# Patient Record
Sex: Male | Born: 1945 | Race: White | Hispanic: No | Marital: Married | Smoking: Former smoker
Health system: Southern US, Community
[De-identification: ages and names within clinical notes are randomized; demographics above are authoritative.]

## PROBLEM LIST (undated history)

## (undated) ENCOUNTER — Ambulatory Visit: Discharge status: Absent without leave | Payer: Medicare Other

## (undated) DIAGNOSIS — R002 Palpitations: Secondary | ICD-10-CM

## (undated) DIAGNOSIS — T7840XA Allergy, unspecified, initial encounter: Secondary | ICD-10-CM

## (undated) DIAGNOSIS — G8929 Other chronic pain: Secondary | ICD-10-CM

## (undated) DIAGNOSIS — M898X1 Other specified disorders of bone, shoulder: Secondary | ICD-10-CM

## (undated) DIAGNOSIS — F4002 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: Secondary | ICD-10-CM

## (undated) DIAGNOSIS — R351 Nocturia: Secondary | ICD-10-CM

## (undated) DIAGNOSIS — F32A Depression, unspecified: Secondary | ICD-10-CM

## (undated) DIAGNOSIS — F1011 Alcohol abuse, in remission: Secondary | ICD-10-CM

## (undated) DIAGNOSIS — F419 Anxiety disorder, unspecified: Secondary | ICD-10-CM

## (undated) DIAGNOSIS — J309 Allergic rhinitis, unspecified: Secondary | ICD-10-CM

## (undated) DIAGNOSIS — M19049 Primary osteoarthritis, unspecified hand: Secondary | ICD-10-CM

## (undated) DIAGNOSIS — Z8639 Personal history of other endocrine, nutritional and metabolic disease: Secondary | ICD-10-CM

## (undated) DIAGNOSIS — F1910 Other psychoactive substance abuse, uncomplicated: Secondary | ICD-10-CM

## (undated) DIAGNOSIS — E7800 Pure hypercholesterolemia, unspecified: Secondary | ICD-10-CM

## (undated) DIAGNOSIS — M1990 Unspecified osteoarthritis, unspecified site: Secondary | ICD-10-CM

## (undated) HISTORY — DX: Other psychoactive substance abuse, uncomplicated: F19.10

## (undated) HISTORY — DX: Anxiety disorder, unspecified: F41.9

## (undated) HISTORY — DX: Other specified disorders of bone, shoulder: M89.8X1

## (undated) HISTORY — DX: Palpitations: R00.2

## (undated) HISTORY — DX: Allergic rhinitis, unspecified: J30.9

## (undated) HISTORY — PX: EYE SURGERY: SHX253

## (undated) HISTORY — DX: Nocturia: R35.1

## (undated) HISTORY — DX: Unspecified osteoarthritis, unspecified site: M19.90

## (undated) HISTORY — PX: NO PAST SURGERIES: SHX2092

## (undated) HISTORY — DX: Primary osteoarthritis, unspecified hand: M19.049

## (undated) HISTORY — DX: 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: F40.02

## (undated) HISTORY — DX: Other chronic pain: G89.29

## (undated) HISTORY — DX: Pure hypercholesterolemia, unspecified: E78.00

## (undated) HISTORY — DX: Personal history of other endocrine, nutritional and metabolic disease: Z86.39

## (undated) HISTORY — DX: Alcohol abuse, in remission: F10.11

## (undated) HISTORY — DX: Depression, unspecified: F32.A

## (undated) HISTORY — DX: Allergy, unspecified, initial encounter: T78.40XA

---

## 2004-04-13 ENCOUNTER — Emergency Department: Payer: Self-pay | Admitting: Emergency Medicine

## 2005-08-04 ENCOUNTER — Ambulatory Visit: Payer: Self-pay | Admitting: General Surgery

## 2006-12-11 DIAGNOSIS — J3089 Other allergic rhinitis: Secondary | ICD-10-CM | POA: Insufficient documentation

## 2006-12-11 DIAGNOSIS — F4000 Agoraphobia, unspecified: Secondary | ICD-10-CM | POA: Insufficient documentation

## 2006-12-11 DIAGNOSIS — F411 Generalized anxiety disorder: Secondary | ICD-10-CM | POA: Insufficient documentation

## 2006-12-11 DIAGNOSIS — M19049 Primary osteoarthritis, unspecified hand: Secondary | ICD-10-CM | POA: Insufficient documentation

## 2009-03-12 DIAGNOSIS — E7800 Pure hypercholesterolemia, unspecified: Secondary | ICD-10-CM | POA: Insufficient documentation

## 2011-09-22 DIAGNOSIS — F419 Anxiety disorder, unspecified: Secondary | ICD-10-CM | POA: Insufficient documentation

## 2011-09-22 DIAGNOSIS — Z005 Encounter for examination of potential donor of organ and tissue: Secondary | ICD-10-CM | POA: Insufficient documentation

## 2011-09-22 DIAGNOSIS — E669 Obesity, unspecified: Secondary | ICD-10-CM | POA: Insufficient documentation

## 2014-03-08 ENCOUNTER — Emergency Department: Payer: Self-pay | Admitting: Emergency Medicine

## 2014-12-19 ENCOUNTER — Ambulatory Visit: Payer: Self-pay | Admitting: Physician Assistant

## 2015-01-20 ENCOUNTER — Telehealth: Payer: Self-pay | Admitting: Family Medicine

## 2015-01-20 NOTE — Telephone Encounter (Signed)
Pt sees Jeff Baker and states that he thinks he has ask him about hearing aids.  He thinks he has difficulty hearing.  He just wants to know some one to see for hearing loss testing.  His call back is [phone]  Thank Con Memos

## 2015-01-20 NOTE — Telephone Encounter (Signed)
[facility] would evaluate him for hearing loss and has audiologists there if further testing is required.

## 2015-01-20 NOTE — Telephone Encounter (Signed)
Would you prefer patient to schedule office visit with [place] to be evaluated first? Please advise

## 2015-01-20 NOTE — Telephone Encounter (Signed)
LMOVM for pt to return call. Need more information.

## 2015-01-20 NOTE — Telephone Encounter (Signed)
Pt called wanting to know an audiologist that you could refer him to f.  Call back  [phone]  Thanks, Con Memos

## 2015-03-06 DIAGNOSIS — R002 Palpitations: Secondary | ICD-10-CM | POA: Insufficient documentation

## 2015-03-06 DIAGNOSIS — M898X1 Other specified disorders of bone, shoulder: Secondary | ICD-10-CM | POA: Insufficient documentation

## 2015-03-06 DIAGNOSIS — M1990 Unspecified osteoarthritis, unspecified site: Secondary | ICD-10-CM | POA: Insufficient documentation

## 2015-03-06 DIAGNOSIS — Z8639 Personal history of other endocrine, nutritional and metabolic disease: Secondary | ICD-10-CM | POA: Insufficient documentation

## 2015-03-09 ENCOUNTER — Ambulatory Visit
Admission: RE | Admit: 2015-03-09 | Discharge: 2015-03-09 | Disposition: A | Discharge status: Home / Self Care | Payer: Medicare Other | Source: Ambulatory Visit | Attending: Family Medicine | Admitting: Family Medicine

## 2015-03-09 ENCOUNTER — Telehealth: Payer: Self-pay

## 2015-03-09 ENCOUNTER — Encounter: Payer: Self-pay | Admitting: Family Medicine

## 2015-03-09 ENCOUNTER — Ambulatory Visit (INDEPENDENT_AMBULATORY_CARE_PROVIDER_SITE_OTHER): Payer: Medicare Other | Admitting: Family Medicine

## 2015-03-09 VITALS — BP 116/66 | HR 64 | Temp 98.0°F | Resp 16 | Ht 73.0 in | Wt 265.4 lb

## 2015-03-09 DIAGNOSIS — Z87442 Personal history of urinary calculi: Secondary | ICD-10-CM | POA: Diagnosis present

## 2015-03-09 DIAGNOSIS — F411 Generalized anxiety disorder: Secondary | ICD-10-CM

## 2015-03-09 DIAGNOSIS — R351 Nocturia: Secondary | ICD-10-CM | POA: Diagnosis not present

## 2015-03-09 DIAGNOSIS — Z1211 Encounter for screening for malignant neoplasm of colon: Secondary | ICD-10-CM

## 2015-03-09 DIAGNOSIS — Z23 Encounter for immunization: Secondary | ICD-10-CM | POA: Diagnosis not present

## 2015-03-09 DIAGNOSIS — Z0000 Encounter for general adult medical examination without abnormal findings: Secondary | ICD-10-CM

## 2015-03-09 DIAGNOSIS — M546 Pain in thoracic spine: Secondary | ICD-10-CM | POA: Diagnosis not present

## 2015-03-09 DIAGNOSIS — N200 Calculus of kidney: Secondary | ICD-10-CM | POA: Diagnosis not present

## 2015-03-09 DIAGNOSIS — M7662 Achilles tendinitis, left leg: Secondary | ICD-10-CM | POA: Diagnosis not present

## 2015-03-09 DIAGNOSIS — Z131 Encounter for screening for diabetes mellitus: Secondary | ICD-10-CM | POA: Diagnosis not present

## 2015-03-09 MED ORDER — SERTRALINE HCL 100 MG PO TABS: 100.0000 mg | ORAL_TABLET | Freq: Every day | ORAL | Status: DC

## 2015-03-09 NOTE — Patient Instructions (Signed)
We will call you with referrals, lab and x-ray report.

## 2015-03-09 NOTE — Telephone Encounter (Signed)
-----   Message from Carmon Ginsberg, [place] sent at 03/09/2015  1:02 PM EST ----- There is a left kidney stone described as small. Do you wish to see a urologist at this time? May consider drinking more fluids esp.water and see if it will flush out.

## 2015-03-09 NOTE — Telephone Encounter (Signed)
Patient has been advised he declined seeing urologist at this time, advised patient to drink adequate amounts of fluid especially water to help flush it out. KW

## 2015-03-09 NOTE — Progress Notes (Signed)
Subjective:     Patient ID: Jeff Baker, male   DOB: 01-Jun-1945, 70 y.o.   MRN: [id number]  HPI  Chief Complaint  Patient presents with  . Annual Exam    Patient comes in office today for his annual physical. Patient would like to address at todays visit; pain in his kidneys for the past 3-4 weeks, pain in his left achilles tendon and discuss tendinitis getting worse. Patient is due today for Flu vaccine he states his last Tdap was within the past 10 years, it is reccomended that patient get shingles vaccine today he states that he will address with physcian  States he has been having persistent left sided back pain for 3-4 weeks. Mild discomfort when he changes positions. Reports that he has been told he had kidney stones in the past but does not remember passing any.    Review of Systems General: Feeling well. Reports tetanus within 10 years. Agrees to Zoster but not in stock today. HEENT: does not see a dentist but is pending an eye exam: "early cataracts". States he had an audiology evaluation last year due to chronic tinnitus and was told he needed hearing aids. Wishes copy of report to take to different hearing aid vendors. Cardiovascular: no chest pain, shortness of breath, or palpitations GI: no heartburn, no change in bowel habits. He is due this year for repeat colonoscopy GU: nocturia x 2,no other change in bladder habits  Neuro: no change in memory; defers cognitive screen. Psychiatric: not depressed Musculoskeletal: left Achilles pain since September. Takes ibuprofen 600 mg.twice daily as needed. Currently notices it primarily when he goes up hills. Fall has occurred when he tripped in stream beds while fly fishing.    Objective:   Physical Exam  Constitutional: He appears well-developed and well-nourished. No distress.  Eyes: PERRLA, EOMI Neck: no thyromegaly, tenderness or nodules, no carotid bruits. ENT: TM's intact without inflammation; No tonsillar enlargement or  exudate, Lungs: Clear Heart : RRR without murmur or gallop Abd: bowel sounds present, soft, non-tender, no organomegaly Rectal: Prostate check deferred due to patient's body habitus Extremities: no edema, left achilles tender at insertion. DF/PF 5/5 Back: localizes pain to left lower thoracic area. No rash or significant tenderness to palpation. SLR's to 80 degrees without radiation of pain.     Assessment:    1. Medicare annual wellness visit, subsequent  2. Anxiety, generalized - sertraline (ZOLOFT) 100 MG tablet; Take 1 tablet (100 mg total) by mouth daily.  Dispense: 90 tablet; Refill: 3 - Ambulatory referral to Orthopedic Surgery  3. Tendonitis, Achilles, left  4. Left-sided thoracic back pain  5. History of kidney stones - DG Abd 2 Views; Future  6. Need for influenza vaccination - Flu Vaccine QUAD 36+ mos IM  7. Nocturia - PSA  8. Screen for colon cancer - Ambulatory referral to Gastroenterology  9. Screening for diabetes mellitus - Comprehensive metabolic panel     Plan:    Further f/u pending labs and receipt of audiology consult note.

## 2015-03-10 LAB — COMPREHENSIVE METABOLIC PANEL
A/G RATIO: 1.7 (ref 1.1–2.5)
ALT: 23 IU/L (ref 0–44)
AST: 28 IU/L (ref 0–40)
Albumin: 4.3 g/dL (ref 3.6–4.8)
Alkaline Phosphatase: 68 IU/L (ref 39–117)
BUN/Creatinine Ratio: 20 (ref 10–22)
BUN: 20 mg/dL (ref 8–27)
Bilirubin Total: 0.8 mg/dL (ref 0.0–1.2)
CALCIUM: 9.5 mg/dL (ref 8.6–10.2)
CO2: 25 mmol/L (ref 18–29)
CREATININE: 0.99 mg/dL (ref 0.76–1.27)
Chloride: 103 mmol/L (ref 96–106)
GFR, EST AFRICAN AMERICAN: 89 mL/min/{1.73_m2} (ref >59)
GFR, EST NON AFRICAN AMERICAN: 77 mL/min/{1.73_m2} (ref >59)
GLUCOSE: 103 mg/dL — AB (ref 65–99)
Globulin, Total: 2.6 g/dL (ref 1.5–4.5)
Potassium: 4.8 mmol/L (ref 3.5–5.2)
Sodium: 143 mmol/L (ref 134–144)
TOTAL PROTEIN: 6.9 g/dL (ref 6.0–8.5)

## 2015-03-10 LAB — PSA: Prostate Specific Ag, Serum: 0.8 ng/mL (ref 0.0–4.0)

## 2015-03-13 ENCOUNTER — Other Ambulatory Visit: Payer: Self-pay

## 2015-03-13 ENCOUNTER — Telehealth: Payer: Self-pay

## 2015-03-13 DIAGNOSIS — M7660 Achilles tendinitis, unspecified leg: Secondary | ICD-10-CM | POA: Insufficient documentation

## 2015-03-13 NOTE — Telephone Encounter (Signed)
Gastroenterology Pre-Procedure Review  Request Date: 04/28/15 Requesting Physician:  Carmon Ginsberg, PA  PATIENT REVIEW QUESTIONS: The patient responded to the following health history questions as indicated:    1. Are you having any GI issues? no 2. Do you have a personal history of Polyps? no 3. Do you have a family history of Colon Cancer or Polyps? no 4. Diabetes Mellitus? no 5. Joint replacements in the past 12 months?no 6. Major health problems in the past 3 months?no 7. Any artificial heart valves, MVP, or defibrillator?no    MEDICATIONS & ALLERGIES:    Patient reports the following regarding taking any anticoagulation/antiplatelet therapy:   Plavix, Coumadin, Eliquis, Xarelto, Lovenox, Pradaxa, Brilinta, or Effient? no Aspirin? yes (ASA 81mg )  Patient confirms/reports the following medications:  Current Outpatient Prescriptions  Medication Sig Dispense Refill  . aspirin EC 81 MG tablet Take by mouth.    . Multiple Vitamin (MULTIVITAMIN) capsule Take by mouth.    . sertraline (ZOLOFT) 100 MG tablet Take 1 tablet (100 mg total) by mouth daily. 90 tablet 3   No current facility-administered medications for this visit.    Patient confirms/reports the following allergies:  No Known Allergies  No orders of the defined types were placed in this encounter.    AUTHORIZATION INFORMATION Primary Insurance: 1D#: Group #:  Secondary Insurance: 1D#: Group #:  SCHEDULE INFORMATION: Date: 04/28/15 Time: Location: [facility]

## 2015-04-05 ENCOUNTER — Other Ambulatory Visit: Payer: Self-pay | Admitting: Family Medicine

## 2015-04-06 ENCOUNTER — Other Ambulatory Visit: Payer: Self-pay | Admitting: Family Medicine

## 2015-04-28 ENCOUNTER — Encounter: Payer: Self-pay | Admitting: Anesthesiology

## 2015-04-28 ENCOUNTER — Encounter
Admission: RE | Disposition: A | Discharge status: Home / Self Care | Payer: Self-pay | Source: Ambulatory Visit | Attending: Gastroenterology

## 2015-04-28 ENCOUNTER — Ambulatory Visit
Admission: RE | Admit: 2015-04-28 | Discharge: 2015-04-28 | Disposition: A | Discharge status: Home / Self Care | Payer: Medicare Other | Source: Ambulatory Visit | Attending: Gastroenterology | Admitting: Gastroenterology

## 2015-04-28 ENCOUNTER — Ambulatory Visit: Payer: Medicare Other | Admitting: Anesthesiology

## 2015-04-28 DIAGNOSIS — M19049 Primary osteoarthritis, unspecified hand: Secondary | ICD-10-CM | POA: Insufficient documentation

## 2015-04-28 DIAGNOSIS — D122 Benign neoplasm of ascending colon: Secondary | ICD-10-CM | POA: Diagnosis not present

## 2015-04-28 DIAGNOSIS — Z811 Family history of alcohol abuse and dependence: Secondary | ICD-10-CM | POA: Diagnosis not present

## 2015-04-28 DIAGNOSIS — D123 Benign neoplasm of transverse colon: Secondary | ICD-10-CM | POA: Insufficient documentation

## 2015-04-28 DIAGNOSIS — G8929 Other chronic pain: Secondary | ICD-10-CM | POA: Diagnosis not present

## 2015-04-28 DIAGNOSIS — Z1211 Encounter for screening for malignant neoplasm of colon: Secondary | ICD-10-CM | POA: Insufficient documentation

## 2015-04-28 DIAGNOSIS — F419 Anxiety disorder, unspecified: Secondary | ICD-10-CM | POA: Diagnosis not present

## 2015-04-28 DIAGNOSIS — F17210 Nicotine dependence, cigarettes, uncomplicated: Secondary | ICD-10-CM | POA: Diagnosis not present

## 2015-04-28 DIAGNOSIS — Z820 Family history of epilepsy and other diseases of the nervous system: Secondary | ICD-10-CM | POA: Insufficient documentation

## 2015-04-28 DIAGNOSIS — J309 Allergic rhinitis, unspecified: Secondary | ICD-10-CM | POA: Insufficient documentation

## 2015-04-28 DIAGNOSIS — Z79899 Other long term (current) drug therapy: Secondary | ICD-10-CM | POA: Insufficient documentation

## 2015-04-28 DIAGNOSIS — R351 Nocturia: Secondary | ICD-10-CM | POA: Diagnosis not present

## 2015-04-28 DIAGNOSIS — M1990 Unspecified osteoarthritis, unspecified site: Secondary | ICD-10-CM | POA: Insufficient documentation

## 2015-04-28 DIAGNOSIS — R002 Palpitations: Secondary | ICD-10-CM | POA: Diagnosis not present

## 2015-04-28 DIAGNOSIS — Z8489 Family history of other specified conditions: Secondary | ICD-10-CM | POA: Diagnosis not present

## 2015-04-28 DIAGNOSIS — F17290 Nicotine dependence, other tobacco product, uncomplicated: Secondary | ICD-10-CM | POA: Diagnosis not present

## 2015-04-28 DIAGNOSIS — M25519 Pain in unspecified shoulder: Secondary | ICD-10-CM | POA: Insufficient documentation

## 2015-04-28 DIAGNOSIS — Z7982 Long term (current) use of aspirin: Secondary | ICD-10-CM | POA: Diagnosis not present

## 2015-04-28 DIAGNOSIS — E7800 Pure hypercholesterolemia, unspecified: Secondary | ICD-10-CM | POA: Insufficient documentation

## 2015-04-28 HISTORY — PX: COLONOSCOPY WITH PROPOFOL: SHX5780

## 2015-04-28 SURGERY — COLONOSCOPY WITH PROPOFOL
Anesthesia: General

## 2015-04-28 MED ORDER — PROPOFOL 10 MG/ML IV BOLUS
INTRAVENOUS | Status: DC | PRN
  Administered 2015-04-28: 50 mg via INTRAVENOUS

## 2015-04-28 MED ORDER — SODIUM CHLORIDE 0.9 % IV SOLN
INTRAVENOUS | Status: DC
  Administered 2015-04-28: 1000 mL via INTRAVENOUS

## 2015-04-28 MED ORDER — LIDOCAINE HCL (CARDIAC) 20 MG/ML IV SOLN
INTRAVENOUS | Status: DC | PRN
  Administered 2015-04-28: 60 mg via INTRAVENOUS

## 2015-04-28 MED ORDER — PROPOFOL 500 MG/50ML IV EMUL
INTRAVENOUS | Status: DC | PRN
  Administered 2015-04-28: 100 ug/kg/min via INTRAVENOUS

## 2015-04-28 NOTE — H&P (Signed)
  [facility]  [address] [place], [place] [address] Phone: [phone] Fax : [phone]  Primary Care Physician:  Carmon Ginsberg, [place] Primary Gastroenterologist:  Dr. Allen Norris  Pre-Procedure History & Physical: HPI:  Jeff Baker is a 70 y.o. male is here for a screening colonoscopy.   Past Medical History  Diagnosis Date  . Arthritis   . Anxiety   . 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 History of alcohol abuse   . Allergic rhinitis   . Pure hypercholesterolemia   . Osteoarthrosis, hand   . Chronic scapular pain   . History of hyperkalemia   . Nocturia   . Palpitations     Past Surgical History  Procedure Laterality Date  . No past surgeries      Prior to Admission medications   Medication Sig Start Date End Date Taking? Authorizing Provider  aspirin EC 81 MG tablet Take by mouth.    Historical Provider, MD  Multiple Vitamin (MULTIVITAMIN) capsule Take by mouth.    Historical Provider, MD  sertraline (ZOLOFT) 100 MG tablet Take 1 tablet (100 mg total) by mouth daily. 03/09/15   Carmon Ginsberg, PA    Allergies as of 03/13/2015  . (No Known Allergies)    Family History  Problem Relation Age of Onset  . Dementia Mother   . Alcohol abuse Father   . Drug abuse Father     Social History   Social History  . Marital Status: Married    Spouse Name: N/A  . Number of Children: N/A  . Years of Education: N/A   Occupational History  . Not on file.   Social History Main Topics  . Smoking status: Current Every Day Smoker    Types: Cigarettes, Cigars  . Smokeless tobacco: Never Used     Comment: Former tobacco smoker quit in 1977  . Alcohol Use: No     Comment: had a history of alcohol abuse been sober >31 years  . Drug Use: No  . Sexual Activity: Not on file   Other Topics Concern  . Not on file   Social History Narrative    Review of Systems: See HPI, otherwise negative ROS  Physical Exam: BP 148/68 mmHg  Pulse 59   Temp(Src) 97.5 F (36.4 C) (Oral)  Resp 20  Ht 6\' 1"  (1.854 m)  Wt 258 lb (117.028 kg)  BMI 34.05 kg/m2  SpO2 98% General:   Alert,  pleasant and cooperative in NAD Head:  Normocephalic and atraumatic. Neck:  Supple; no masses or thyromegaly. Lungs:  Clear throughout to auscultation.    Heart:  Regular rate and rhythm. Abdomen:  Soft, nontender and nondistended. Normal bowel sounds, without guarding, and without rebound.   Neurologic:  Alert and  oriented x4;  grossly normal neurologically.  Impression/Plan: Jeff Baker is now here to undergo a screening colonoscopy.  Risks, benefits, and alternatives regarding colonoscopy have been reviewed with the patient.  Questions have been answered.  All parties agreeable.

## 2015-04-28 NOTE — Transfer of Care (Signed)
Immediate Anesthesia Transfer of Care Note  Patient: Jeff Baker  Procedure(s) Performed: Procedure(s): COLONOSCOPY WITH PROPOFOL (N/A)  Patient Location: Endoscopy Unit  Anesthesia Type:General  Level of Consciousness: awake, alert , oriented and patient cooperative  Airway & Oxygen Therapy: Patient Spontanous Breathing and Patient connected to nasal cannula oxygen  Post-op Assessment: Report given to RN, Post -op Vital signs reviewed and stable and Patient moving all extremities X 4  Post vital signs: Reviewed and stable  Last Vitals:  Filed Vitals:   04/28/15 0911  BP: 148/68  Pulse: 59  Temp: 36.4 C  Resp: 20    Complications: No apparent anesthesia complications

## 2015-04-28 NOTE — Op Note (Signed)
[facility] Gastroenterology Patient Name: Jeff Baker Procedure Date: 04/28/2015 10:18 AM MRN: [id number] Account #: [id number] Date of Birth: 11-16-1945 Admit Type: Outpatient Age: 70 Room: [facility] ENDO ROOM 4 Gender: Male Note Status: Finalized Procedure:            Colonoscopy Indications:          Screening for colorectal malignant neoplasm Providers:            Lucilla Lame, MD Referring MD:         Rose Fillers. Jaynie Crumble, MD (Referring MD) Medicines:            Propofol per Anesthesia Complications:        No immediate complications. Procedure:            Pre-Anesthesia Assessment:                       - Prior to the procedure, a History and Physical was                        performed, and patient medications and allergies were                        reviewed. The patient's tolerance of previous                        anesthesia was also reviewed. The risks and benefits of                        the procedure and the sedation options and risks were                        discussed with the patient. All questions were                        answered, and informed consent was obtained. Prior                        Anticoagulants: The patient has taken no previous                        anticoagulant or antiplatelet agents. ASA Grade                        Assessment: II - A patient with mild systemic disease.                        After reviewing the risks and benefits, the patient was                        deemed in satisfactory condition to undergo the                        procedure.                       After obtaining informed consent, the colonoscope was                        passed under direct vision. Throughout the procedure,  the patient's blood pressure, pulse, and oxygen                        saturations were monitored continuously. The                        Colonoscope was introduced through the anus and         advanced to the the cecum, identified by appendiceal                        orifice and ileocecal valve. The colonoscopy was                        performed without difficulty. The patient tolerated the                        procedure well. The quality of the bowel preparation                        was excellent. Findings:      The perianal and digital rectal examinations were normal.      A 4 mm polyp was found in the ascending colon. The polyp was sessile.       The polyp was removed with a cold biopsy forceps. Resection and       retrieval were complete.      A 3 mm polyp was found in the transverse colon. The polyp was sessile.       The polyp was removed with a cold biopsy forceps. Resection and       retrieval were complete. Impression:           - One 4 mm polyp in the ascending colon, removed with a                        cold biopsy forceps. Resected and retrieved.                       - One 3 mm polyp in the transverse colon, removed with                        a cold biopsy forceps. Resected and retrieved. Recommendation:       - Await pathology results.                       - Repeat colonoscopy in 5 years if polyp adenoma and 10                        years if hyperplastic Procedure Code(s):    --- Professional ---                       [phone], Colonoscopy, flexible; with biopsy, single or                        multiple Diagnosis Code(s):    --- Professional ---                       Z12.11, Encounter for screening for malignant neoplasm  of colon                       D12.2, Benign neoplasm of ascending colon                       D12.3, Benign neoplasm of transverse colon (hepatic                        flexure or splenic flexure) CPT copyright 2016 American Medical Association. All rights reserved. The codes documented in this report are preliminary and upon coder review may  be revised to meet current compliance requirements. Lucilla Lame, MD 04/28/2015 10:37:49 AM This report has been signed electronically. Number of Addenda: 0 Note Initiated On: 04/28/2015 10:18 AM Scope Withdrawal Time: 0 hours 11 minutes 17 seconds  Total Procedure Duration: 0 hours 13 minutes 44 seconds       [facility]

## 2015-04-28 NOTE — Anesthesia Preprocedure Evaluation (Signed)
Anesthesia Evaluation  Patient identified by MRN, date of birth, ID band Patient awake    Reviewed: Allergy & Precautions, H&P , NPO status , Patient's Chart, lab work & pertinent test results, reviewed documented beta blocker date and time   History of Anesthesia Complications Negative for: history of anesthetic complications  Airway Mallampati: II  TM Distance: >3 FB Neck ROM: full    Dental no notable dental hx. (+) Partial Upper, Missing, Chipped, Poor Dentition   Pulmonary neg shortness of breath, neg sleep apnea, neg COPD, Recent URI , Residual Cough, Current Smoker (Cigars),    Pulmonary exam normal breath sounds clear to auscultation       Cardiovascular Exercise Tolerance: Good negative cardio ROS Normal cardiovascular exam Rhythm:regular Rate:Normal     Neuro/Psych PSYCHIATRIC DISORDERS (Anxiety) negative neurological ROS     GI/Hepatic negative GI ROS, Neg liver ROS,   Endo/Other  negative endocrine ROS  Renal/GU negative Renal ROS  negative genitourinary   Musculoskeletal   Abdominal   Peds  Hematology negative hematology ROS (+)   Anesthesia Other Findings Past Medical History:   Arthritis                                                    Anxiety                                                      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                           History of alcohol abuse                                     Allergic rhinitis                                            Pure hypercholesterolemia                                    Osteoarthrosis, hand                                         Chronic scapular pain                                        History of hyperkalemia                                      Nocturia  Palpitations                                                 Reproductive/Obstetrics negative OB ROS                              Anesthesia Physical Anesthesia Plan  ASA: II  Anesthesia Plan: General   Post-op Pain Management:    Induction:   Airway Management Planned:   Additional Equipment:   Intra-op Plan:   Post-operative Plan:   Informed Consent: I have reviewed the patients History and Physical, chart, labs and discussed the procedure including the risks, benefits and alternatives for the proposed anesthesia with the patient or authorized representative who has indicated his/her understanding and acceptance.   Dental Advisory Given  Plan Discussed with: Anesthesiologist, CRNA and Surgeon  Anesthesia Plan Comments:         Anesthesia Quick Evaluation

## 2015-04-29 ENCOUNTER — Encounter: Payer: Self-pay | Admitting: Gastroenterology

## 2015-04-29 LAB — SURGICAL PATHOLOGY

## 2015-04-30 ENCOUNTER — Encounter: Payer: Self-pay | Admitting: Gastroenterology

## 2015-04-30 NOTE — Anesthesia Postprocedure Evaluation (Signed)
Anesthesia Post Note  Patient: Jeff Baker  Procedure(s) Performed: Procedure(s) (LRB): COLONOSCOPY WITH PROPOFOL (N/A)  Patient location during evaluation: Endoscopy Anesthesia Type: General Level of consciousness: awake and alert Pain management: pain level controlled Vital Signs Assessment: post-procedure vital signs reviewed and stable Respiratory status: spontaneous breathing, nonlabored ventilation, respiratory function stable and patient connected to nasal cannula oxygen Cardiovascular status: blood pressure returned to baseline and stable Postop Assessment: no signs of nausea or vomiting Anesthetic complications: no    Last Vitals:  Filed Vitals:   04/28/15 1100 04/28/15 1110  BP: 137/76 139/73  Pulse:    Temp:    Resp:      Last Pain:  Filed Vitals:   04/29/15 0747  PainSc: 0-No pain                 Martha Clan

## 2015-07-31 ENCOUNTER — Ambulatory Visit (INDEPENDENT_AMBULATORY_CARE_PROVIDER_SITE_OTHER): Payer: Medicare Other | Admitting: Family Medicine

## 2015-07-31 ENCOUNTER — Encounter: Payer: Self-pay | Admitting: Family Medicine

## 2015-07-31 VITALS — BP 138/70 | HR 72 | Temp 97.9°F | Resp 16 | Wt 268.6 lb

## 2015-07-31 DIAGNOSIS — T148 Other injury of unspecified body region: Secondary | ICD-10-CM

## 2015-07-31 DIAGNOSIS — T148XXA Other injury of unspecified body region, initial encounter: Secondary | ICD-10-CM

## 2015-07-31 DIAGNOSIS — W57XXXA Bitten or stung by nonvenomous insect and other nonvenomous arthropods, initial encounter: Secondary | ICD-10-CM | POA: Diagnosis not present

## 2015-07-31 MED ORDER — DOXYCYCLINE HYCLATE 100 MG PO TABS: 100.0000 mg | ORAL_TABLET | Freq: Two times a day (BID) | ORAL | Status: DC

## 2015-07-31 NOTE — Progress Notes (Signed)
Subjective:     Patient ID: Jeff Baker, male   DOB: [birth year], 70 y.o.   MRN: [id number]  HPI  Chief Complaint  Patient presents with  . Skin Problem    Patient comes in office today with concerns of changes to his skin. Patient believes that he may have been bitten by an insect wihile walking [place] the wilderness. Patient states that he has two red circle like rings on his left forearm that has been present for a week, patient denies skin is either itchy or burning.   States he has removed ticks from other areas of his body and also wishes me to check one on his left mid-back. States he noticed the rash after being in the woods in [place]. No flu like sx reported.   Review of Systems     Objective:   Physical Exam  Constitutional: He appears well-developed and well-nourished. No distress.  Skin:  Two small concentric bruised like areas with central clearing noted on his anterior forearm. Non-tender to the touch with no underlying induration. Tick bite on left mid-back cleaned with alcohol with no f.b. Visualized.       Assessment:    1. Tick bite - doxycycline (VIBRA-TABS) 100 MG tablet; Take 1 tablet (100 mg total) by mouth 2 (two) times daily.  Dispense: 14 tablet; Refill: 1  2. Bruising     Plan:    To start antibiotic if rash does not improve with warm compresses or he develops flu-like sx.

## 2015-07-31 NOTE — Patient Instructions (Signed)
Apply arm compresses to arm rash/bruising. If expanding area of redness or not improving add antibiotic.

## 2015-11-05 ENCOUNTER — Ambulatory Visit: Payer: Self-pay | Admitting: Family Medicine

## 2015-12-01 ENCOUNTER — Encounter: Payer: Self-pay | Admitting: Family Medicine

## 2015-12-01 ENCOUNTER — Ambulatory Visit (INDEPENDENT_AMBULATORY_CARE_PROVIDER_SITE_OTHER): Payer: Medicare Other | Admitting: Family Medicine

## 2015-12-01 VITALS — BP 128/64 | HR 71 | Temp 98.3°F | Resp 14 | Wt 250.6 lb

## 2015-12-01 DIAGNOSIS — S51012A Laceration without foreign body of left elbow, initial encounter: Secondary | ICD-10-CM

## 2015-12-01 DIAGNOSIS — Z23 Encounter for immunization: Secondary | ICD-10-CM

## 2015-12-01 NOTE — Patient Instructions (Signed)
Skin Tear Care  A skin tear is a wound in which the top layer of skin has peeled off. This is a common problem with aging because the skin becomes thinner and more fragile as a person gets older. In addition, some medicines, such as oral corticosteroids, can lead to skin thinning if taken for long periods of time.   A skin tear is often repaired with tape or skin adhesive strips. This keeps the skin that has been peeled off in contact with the healthier skin beneath. Depending on the location of the wound, a bandage (dressing) may be applied over the tape or skin adhesive strips. Sometimes, during the healing process, the skin turns black and dies. Even when this happens, the torn skin acts as a good dressing until the skin underneath gets healthier and repairs itself.  HOME CARE INSTRUCTIONS    Change dressings once per day or as directed by your caregiver.   Gently clean the skin tear and the area around the tear using saline solution or mild soap and water.   Do not rub the injured skin dry. Let the area air dry.   Apply petroleum jelly or an antibiotic cream or ointment to keep the tear moist. This will help the wound heal. Do not allow a scab to form.   If the dressing sticks before the next dressing change, moisten it with warm soapy water and gently remove it.   Protect the injured skin until it has healed.   Only take over-the-counter or prescription medicines as directed by your caregiver.   Take showers or baths using warm soapy water. Apply a new dressing after the shower or bath.   Keep all follow-up appointments as directed by your caregiver.   SEEK IMMEDIATE MEDICAL CARE IF:    You have redness, swelling, or increasing pain in the skin tear.   You havepus coming from the skin tear.   You have chills.   You have a red streak that goes away from the skin tear.   You have a bad smell coming from the tear or dressing.   You have a fever or persistent symptoms for more than 2-3 days.   You  have a fever and your symptoms suddenly get worse.  MAKE SURE YOU:   Understand these instructions.   Will watch this condition.   Will get help right away if your child is not doing well or gets worse.     This information is not intended to replace advice given to you by your health care provider. Make sure you discuss any questions you have with your health care provider.     Document Released: 11/02/2000 Document Revised: 11/02/2011 Document Reviewed: 08/22/2011  [employer]vier Interactive Patient Education 2016 [employer]

## 2015-12-01 NOTE — Progress Notes (Signed)
Patient: Jeff Baker Male    DOB: 1945-10-08   70 y.o.   MRN: [id number] Visit Date: 12/01/2015  Today's Provider: Vernie Murders, PA   Chief Complaint  Patient presents with  . Puncture Wound   Subjective:    HPI Patient is here today requesting a tetanus booster. Patient reports on Sunday he fell down stairs and punctured left forearm. Patient is unsure of last tetanus vaccine. Patient denies any pain. He has been bandaging and applying neosporin to puncture wound.   Past Medical History:  Diagnosis Date  . 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 Allergic rhinitis   . Anxiety   . Arthritis   . Chronic scapular pain   . History of alcohol abuse   . History of hyperkalemia   . Nocturia   . Osteoarthrosis, hand   . Palpitations   . Pure hypercholesterolemia    Past Surgical History:  Procedure Laterality Date  . COLONOSCOPY WITH PROPOFOL N/A 04/28/2015   Procedure: COLONOSCOPY WITH PROPOFOL;  Surgeon: Lucilla Lame, MD;  Location: [facility] ENDOSCOPY;  Service: Endoscopy;  Laterality: N/A;  . NO PAST SURGERIES     Family History  Problem Relation Age of Onset  . Dementia Mother   . Alcohol abuse Father   . Drug abuse Father    No Known Allergies   Previous Medications   ASPIRIN EC 81 MG TABLET    Take by mouth.   MULTIPLE VITAMIN (MULTIVITAMIN) CAPSULE    Take by mouth.   SERTRALINE (ZOLOFT) 100 MG TABLET    Take 1 tablet (100 mg total) by mouth daily.    Review of Systems  Constitutional: Negative.   Respiratory: Negative.   Cardiovascular: Negative.   Skin:       Puncture wound     Social History  Substance Use Topics  . Smoking status: Current Every Day Smoker    Types: Cigarettes, Cigars  . Smokeless tobacco: Never Used     Comment: Former tobacco smoker quit in 1977  . Alcohol use No     Comment: had a history of alcohol abuse been sober >31 years   Objective:   BP 128/64 (BP Location: Right Arm, Patient Position: Sitting, Cuff Size: Large)   Pulse  71   Temp 98.3 F (36.8 C) (Oral)   Resp 14   Wt 250 lb 9.6 oz (113.7 kg)   BMI 33.06 kg/m   Physical Exam  Constitutional: He is oriented to person, place, and time. He appears well-developed and well-nourished. No distress.  HENT:  Head: Normocephalic and atraumatic.  Right Ear: Hearing normal.  Left Ear: Hearing normal.  Nose: Nose normal.  Eyes: Conjunctivae and lids are normal. Right eye exhibits no discharge. Left eye exhibits no discharge. No scleral icterus.  Pulmonary/Chest: Effort normal. No respiratory distress.  Musculoskeletal: Normal range of motion.  Neurological: He is alert and oriented to person, place, and time.  Skin: Skin is intact. No lesion and no rash noted.  Contusion and skin tear of left elbow. No sign of infection. No neurovascular deficit.  Psychiatric: He has a normal mood and affect. His speech is normal and behavior is normal. Thought content normal.      Assessment & Plan:     1. Skin tear of left elbow without complication, initial encounter Fell on some stairs at home and caused a skin tear on the left lateral elbow 2 days ago. No puncture wounds. No sign of infection and no need for skin  closure. Continue Neosporin ointment with Telfa/Coban dressing. Clean daily and redress. Recheck in 1 week as needed.   2. Need for Tdap vaccination - Tdap vaccine greater than or equal to 7yo IM

## 2016-02-03 ENCOUNTER — Ambulatory Visit (INDEPENDENT_AMBULATORY_CARE_PROVIDER_SITE_OTHER): Payer: Medicare Other | Admitting: Physician Assistant

## 2016-02-03 ENCOUNTER — Telehealth: Payer: Self-pay

## 2016-02-03 VITALS — BP 122/54 | HR 76 | Temp 98.5°F | Resp 16 | Ht 74.0 in | Wt 266.0 lb

## 2016-02-03 DIAGNOSIS — M7989 Other specified soft tissue disorders: Secondary | ICD-10-CM | POA: Diagnosis not present

## 2016-02-03 DIAGNOSIS — M79661 Pain in right lower leg: Secondary | ICD-10-CM

## 2016-02-03 NOTE — Telephone Encounter (Signed)
Patient called saying that he has had pain in his lower right leg all of a sudden since Monday (2 days). He reports that his leg is mildly swollen, tender to touch, and warm to touch. Patient denies any injuries. He denies any rash, just redness. Per Mikki Santee, patient needed to be seen today. Appt was scheduled with Adrianna to be seen this afternoon.

## 2016-02-03 NOTE — Patient Instructions (Signed)
Deep Vein Thrombosis A deep vein thrombosis (DVT) is a blood clot (thrombus) that usually occurs in a deep, larger vein of the lower leg or the pelvis, or in an upper extremity such as the arm. These are dangerous and can lead to serious and even life-threatening complications if the clot travels to the lungs. A DVT can damage the valves in your leg veins so that instead of flowing upward, the blood pools in the lower leg. This is called post-thrombotic syndrome, and it can result in pain, swelling, discoloration, and sores on the leg. What are the causes? A DVT is caused by the formation of a blood clot in your leg, pelvis, or arm. Usually, several things contribute to the formation of blood clots. A clot may develop when:  Your blood flow slows down.  Your vein becomes damaged in some way.  You have a condition that makes your blood clot more easily.  What increases the risk? A DVT is more likely to develop in:  People who are older, especially over 60 years of age.  People who are overweight (obese).  People who sit or lie still for a long time, such as during long-distance travel (over 4 hours), bed rest, hospitalization, or during recovery from certain medical conditions like a stroke.  People who do not engage in much physical activity (sedentary lifestyle).  People who have chronic breathing disorders.  People who have a personal or family history of blood clots or blood clotting disease.  People who have peripheral vascular disease (PVD), diabetes, or some types of cancer.  People who have heart disease, especially if the person had a recent heart attack or has congestive heart failure.  People who have neurological diseases that affect the legs (leg paresis).  People who have had a traumatic injury, such as breaking a hip or leg.  People who have recently had major or lengthy surgery, especially on the hip, knee, or abdomen.  People who have had a central line placed  inside a large vein.  People who take medicines that contain the hormone estrogen. These include birth control pills and hormone replacement therapy.  Pregnancy or during childbirth or the postpartum period.  Long plane flights (over 8 hours).  What are the signs or symptoms?  Symptoms of a DVT can include:  Swelling of your leg or arm, especially if one side is much worse.  Warmth and redness of your leg or arm, especially if one side is much worse.  Pain in your arm or leg. If the clot is in your leg, symptoms may be more noticeable or worse when you stand or walk.  A feeling of pins and needles, if the clot is in the arm.  The symptoms of a DVT that has traveled to the lungs (pulmonary embolism, PE) usually start suddenly and include:  Shortness of breath while active or at rest.  Coughing or coughing up blood or blood-tinged mucus.  Chest pain that is often worse with deep breaths.  Rapid or irregular heartbeat.  Feeling light-headed or dizzy.  Fainting.  Feeling anxious.  Sweating.  There may also be pain and swelling in a leg if that is where the blood clot started. These symptoms may represent a serious problem that is an emergency. Do not wait to see if the symptoms will go away. Get medical help right away. Call your local emergency services (911 in the [place] Do not drive yourself to the hospital. How is this diagnosed? Your health   care provider will take a medical history and perform a physical exam. You may also have other tests, including:  Blood tests to assess the clotting properties of your blood.  Imaging tests, such as CT, ultrasound, MRI, X-ray, and other tests to see if you have clots anywhere in your body.  How is this treated? After a DVT is identified, it can be treated. The type of treatment that you receive depends on many factors, such as the cause of your DVT, your risk for bleeding or developing more clots, and other medical conditions that  you have. Sometimes, a combination of treatments is necessary. Treatment options may be combined and include:  Monitoring the blood clot with ultrasound.  Taking medicines by mouth, such as newer blood thinners (anticoagulants), thrombolytics, or warfarin.  Taking anticoagulant medicine by injection or through an IV tube.  Wearing compression stockings or using different types ofdevices.  Surgery (rare) to remove the blood clot or to place a filter in your abdomen to stop the blood clot from traveling to your lungs.  Treatments for a DVT are often divided into immediate treatment and long-term treatment (up to 3 months after DVT). You can work with your health care provider to choose the treatment program that is best for you. Follow these instructions at home: If you are taking a newer oral anticoagulant:  Take the medicine every single day at the same time each day.  Understand what foods and drugs interact with this medicine.  Understand that there are no regular blood tests required when using this medicine.  Understand the side effects of this medicine, including excessive bruising or bleeding. Ask your health care provider or pharmacist about other possible side effects. If you are taking warfarin:  Understand how to take warfarin and know which foods can affect how warfarin works in your body.  Understand that it is dangerous to take too much or too little warfarin. Too much warfarin increases the risk of bleeding. Too little warfarin continues to allow the risk for blood clots.  Follow your PT and INR blood testing schedule. The PT and INR results allow your health care provider to adjust your dose of warfarin. It is very important that you have your PT and INR tested as often as told by your health care provider.  Avoid major changes in your diet, or tell your health care provider before you change your diet. Arrange a visit with a registered dietitian to answer your  questions. Many foods, especially foods that are high in vitamin K, can interfere with warfarin and affect the PT and INR results. Eat a consistent amount of foods that are high in vitamin K, such as: ? Spinach, kale, broccoli, cabbage, collard greens, turnip greens, Brussels sprouts, peas, cauliflower, seaweed, and parsley. ? Beef liver and pork liver. ? Green tea. ? Soybean oil.  Tell your health care provider about any and all medicines, vitamins, and supplements that you take, including aspirin and other over-the-counter anti-inflammatory medicines. Be especially cautious with aspirin and anti-inflammatory medicines. Do not take those before you ask your health care provider if it is safe to do so. This is important because many medicines can interfere with warfarin and affect the PT and INR results.  Do not start or stop taking any over-the-counter or prescription medicine unless your health care provider or pharmacist tells you to do so. If you take warfarin, you will also need to do these things:  Hold pressure over cuts for longer than   usual.  Tell your dentist and other health care providers that you are taking warfarin before you have any procedures in which bleeding may occur.  Avoid alcohol or drink very small amounts. Tell your health care provider if you change your alcohol intake.  Do not use tobacco products, including cigarettes, chewing tobacco, and e-cigarettes. If you need help quitting, ask your health care provider.  Avoid contact sports.  General instructions  Take over-the-counter and prescription medicines only as told by your health care provider. Anticoagulant medicines can have side effects, including easy bruising and difficulty stopping bleeding. If you are prescribed an anticoagulant, you will also need to do these things: ? Hold pressure over cuts for longer than usual. ? Tell your dentist and other health care providers that you are taking anticoagulants  before you have any procedures in which bleeding may occur. ? Avoid contact sports.  Wear a medical alert bracelet or carry a medical alert card that says you have had a PE.  Ask your health care provider how soon you can go back to your normal activities. Stay active to prevent new blood clots from forming.  Make sure to exercise while traveling or when you have been sitting or standing for a long period of time. It is very important to exercise. Exercise your legs by walking or by tightening and relaxing your leg muscles often. Take frequent walks.  Wear compression stockings as told by your health care provider to help prevent more blood clots from forming.  Do not use tobacco products, including cigarettes, chewing tobacco, and e-cigarettes. If you need help quitting, ask your health care provider.  Keep all follow-up appointments with your health care provider. This is important. How is this prevented? Take these actions to decrease your risk of developing another DVT:  Exercise regularly. For at least 30 minutes every day, engage in: ? Activity that involves moving your arms and legs. ? Activity that encourages good blood flow through your body by increasing your heart rate.  Exercise your arms and legs every hour during long-distance travel (over 4 hours). Drink plenty of water and avoid drinking alcohol while traveling.  Avoid sitting or lying in bed for long periods of time without moving your legs.  Maintain a weight that is appropriate for your height. Ask your health care provider what weight is healthy for you.  If you are a woman who is over 35 years of age, avoid unnecessary use of medicines that contain estrogen. These include birth control pills.  Do not smoke, especially if you take estrogen medicines. If you need help quitting, ask your health care provider.  If you are hospitalized, prevention measures may include:  Early walking after surgery, as soon as your  health care provider says that it is safe.  Receiving anticoagulants to prevent blood clots.If you cannot take anticoagulants, other options may be available, such as wearing compression stockings or using different types of devices.  Get help right away if:  You have new or increased pain, swelling, or redness in an arm or leg.  You have numbness or tingling in an arm or leg.  You have shortness of breath while active or at rest.  You have chest pain.  You have a rapid or irregular heartbeat.  You feel light-headed or dizzy.  You cough up blood.  You notice blood in your vomit, bowel movement, or urine. These symptoms may represent a serious problem that is an emergency. Do not wait to see   if the symptoms will go away. Get medical help right away. Call your local emergency services (911 in the [place] Do not drive yourself to the hospital. This information is not intended to replace advice given to you by your health care provider. Make sure you discuss any questions you have with your health care provider. Document Released: 02/07/2005 Document Revised: 07/16/2015 Document Reviewed: 06/04/2014 [employer] Interactive Patient Education  2017 [employer]  

## 2016-02-03 NOTE — Progress Notes (Addendum)
Jeff Baker  MRN: [id number] DOB: 1945/08/02  Subjective:  HPI   The patient is a 70 year old male who presents for evaluation of right leg pain, redness and swelling.  He noticed on Monday afternoon, 48 hours ago, that his right calf was hurting and when he looked at it he saw it was red and swollen.  The patient states that he has not had any recent surgeries or procedures, he has not done any traveling and has not been inactive for long periods of time. He has no smoking history. No active malignancy. No recent injuries or substance exposures. No IVDU. He does note that he has been more tired recently than normal.  He denies any difficulty breathing, shortness of breath, chest pain or swelling of other extremities.  The patient is concerned that he may have a blood cot.  He does have a family history of having 2 siblings with clots that began in the legs and moved into the lungs.  Neither of them had any procedure or surgery prior to the clots.  He does not know of any diagnosed family history of bleeding or clotting disorder.  The patient also denies any trauma to the leg.  He does admit to having arthritic pain in both knees with the right worse than the left.  Measurement of the calves; Left 41.5 cm Right 44 cm  Patient Active Problem List   Diagnosis Date Noted  . Special screening for malignant neoplasms, colon   . Benign neoplasm of ascending colon   . Benign neoplasm of transverse colon   . History of kidney stones 03/09/2015  . Nocturia 03/09/2015  . Arthritis 03/06/2015  . Willing to be kidney donor 09/22/2011  . Adiposity 09/22/2011  . Pure hypercholesterolemia 03/12/2009  . Agoraphobia 12/11/2006  . Anxiety, generalized 12/11/2006  . 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 Allergic rhinitis due to dust 12/11/2006    Past Medical History:  Diagnosis Date  . 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 Allergic  rhinitis   . Anxiety   . Arthritis   . Chronic scapular pain   . History of alcohol abuse   . History of hyperkalemia   . Nocturia   . Osteoarthrosis, hand   . Palpitations   . Pure hypercholesterolemia     Social History   Social History  . Marital status: Married    Spouse name: N/A  . Number of children: N/A  . Years of education: N/A   Occupational History  . Not on file.   Social History Main Topics  . Smoking status: Light Tobacco Smoker    Types: Cigars  . Smokeless tobacco: Never Used     Comment: Gets 3 cigars on the weekends only.  . Alcohol use No     Comment: had a history of alcohol abuse been sober >31 years  . Drug use: No  . Sexual activity: Not on file   Other Topics Concern  . Not on file   Social History Narrative  . No narrative on file    Outpatient Encounter Prescriptions as of 02/03/2016  Medication Sig Note  . aspirin EC 81 MG tablet Take by mouth. 02/03/2016: Every other day  . Multiple Vitamin (MULTIVITAMIN) capsule Take by mouth. 03/09/2015: Received from: [facility]  . sertraline (ZOLOFT) 100 MG tablet Take 1 tablet (100 mg total) by mouth daily.    No facility-administered encounter medications  on file as of 02/03/2016.     No Known Allergies  Review of Systems  Constitutional: Positive for malaise/fatigue. Negative for chills, fever and weight loss.  Respiratory: Positive for cough (sinus congestion). Negative for hemoptysis, sputum production, shortness of breath and wheezing.   Cardiovascular: Positive for claudication (only pain in the legs is the pain in the calf ) and leg swelling. Negative for chest pain, palpitations and orthopnea.  Neurological: Negative for weakness.    Objective:  BP (!) 122/54 (BP Location: Right Arm, Patient Position: Sitting, Cuff Size: Large)   Pulse 76   Temp 98.5 F (36.9 C) (Oral)   Resp 16   Ht 6\' 2"  (1.88 m)   Wt 266 lb (120.7 kg)   BMI 34.15 kg/m   Physical Exam    Constitutional: He is oriented to person, place, and time and well-developed, well-nourished, and in no distress. No distress.  Cardiovascular: Normal rate, regular rhythm, normal heart sounds and intact distal pulses.  Exam reveals no gallop and no friction rub.   No murmur heard. Good cap refill in bilateral lower extremities   Pulmonary/Chest: Effort normal and breath sounds normal.  Musculoskeletal:  There is tenderness to palpation along the right anterior tibia about 8 cm inferior to tibial tuberosity. There is also some erythema and warmth along this area. No puncture wounds or evidence of injury. ROM and gait are normal. Negative Homan's sign. Right leg 44 cm, left leg 41.5 cm  Neurological: He is alert and oriented to person, place, and time.  Skin: Skin is warm and dry. No rash noted. He is not diaphoretic. There is erythema.  Psychiatric: Affect normal.    Assessment and Plan :   Problem List Items Addressed This Visit    None    Visit Diagnoses    Pain and swelling of right lower leg    -  Primary   Relevant Orders   US Venous Img Lower Unilateral Right     Patient is 70 y/o male presenting with symptoms concerning for DVT. With no mechanism of injury or infectious exposure, less concerned with these etiologies. Will send patient for doppler US of right leg to r/o DVT 2/2 family history of unprovoked DVTs and his current symptoms. Will alert patient of the results. Have counseled patient on signs and symptoms that would make him seek emergency care.  Spoke with Parke Poisson. Patient offered both 4:30Pm and 5:30PM same day ultrasound appointments for DVT evaluation. Patient declined both of these because of his wife's Christmas party and not wanting to wait in the waiting room. Patient is getting ultrasound tomorrow.  No Follow-up on file.   The entirety of the information documented in the History of Present Illness, Review of Systems and Physical Exam were personally  obtained by me. Portions of this information were initially documented by [facility] and reviewed by me for thoroughness and accuracy.    Patient Instructions  Deep Vein Thrombosis A deep vein thrombosis (DVT) is a blood clot (thrombus) that usually occurs in a deep, larger vein of the lower leg or the pelvis, or in an upper extremity such as the arm. These are dangerous and can lead to serious and even life-threatening complications if the clot travels to the lungs. A DVT can damage the valves in your leg veins so that instead of flowing upward, the blood pools in the lower leg. This is called post-thrombotic syndrome, and it can result in pain, swelling, discoloration, and sores on the  leg. What are the causes? A DVT is caused by the formation of a blood clot in your leg, pelvis, or arm. Usually, several things contribute to the formation of blood clots. A clot may develop when:  Your blood flow slows down.  Your vein becomes damaged in some way.  You have a condition that makes your blood clot more easily. What increases the risk? A DVT is more likely to develop in:  People who are older, especially over 15 years of age.  People who are overweight (obese).  People who sit or lie still for a long time, such as during long-distance travel (over 4 hours), bed rest, hospitalization, or during recovery from certain medical conditions like a stroke.  People who do not engage in much physical activity (sedentary lifestyle).  People who have chronic breathing disorders.  People who have a personal or family history of blood clots or blood clotting disease.  People who have peripheral vascular disease (PVD), diabetes, or some types of cancer.  People who have heart disease, especially if the person had a recent heart attack or has congestive heart failure.  People who have neurological diseases that affect the legs (leg paresis).  People who have had a traumatic injury, such as breaking a  hip or leg.  People who have recently had major or lengthy surgery, especially on the hip, knee, or abdomen.  People who have had a central line placed inside a large vein.  People who take medicines that contain the hormone estrogen. These include birth control pills and hormone replacement therapy.  Pregnancy or during childbirth or the postpartum period.  Long plane flights (over 8 hours). What are the signs or symptoms?   Symptoms of a DVT can include:  Swelling of your leg or arm, especially if one side is much worse.  Warmth and redness of your leg or arm, especially if one side is much worse.  Pain in your arm or leg. If the clot is in your leg, symptoms may be more noticeable or worse when you stand or walk.  A feeling of pins and needles, if the clot is in the arm. The symptoms of a DVT that has traveled to the lungs (pulmonary embolism, PE) usually start suddenly and include:  Shortness of breath while active or at rest.  Coughing or coughing up blood or blood-tinged mucus.  Chest pain that is often worse with deep breaths.  Rapid or irregular heartbeat.  Feeling light-headed or dizzy.  Fainting.  Feeling anxious.  Sweating. There may also be pain and swelling in a leg if that is where the blood clot started. These symptoms may represent a serious problem that is an emergency. Do not wait to see if the symptoms will go away. Get medical help right away. Call your local emergency services (911 in the [place] Do not drive yourself to the hospital.  How is this diagnosed? Your health care provider will take a medical history and perform a physical exam. You may also have other tests, including:  Blood tests to assess the clotting properties of your blood.  Imaging tests, such as CT, ultrasound, MRI, X-ray, and other tests to see if you have clots anywhere in your body. How is this treated? After a DVT is identified, it can be treated. The type of treatment that  you receive depends on many factors, such as the cause of your DVT, your risk for bleeding or developing more clots, and other medical conditions that you  have. Sometimes, a combination of treatments is necessary. Treatment options may be combined and include:  Monitoring the blood clot with ultrasound.  Taking medicines by mouth, such as newer blood thinners (anticoagulants), thrombolytics, or warfarin.  Taking anticoagulant medicine by injection or through an IV tube.  Wearing compression stockings or using different types ofdevices.  Surgery (rare) to remove the blood clot or to place a filter in your abdomen to stop the blood clot from traveling to your lungs. Treatments for a DVT are often divided into immediate treatment and long-term treatment (up to 3 months after DVT). You can work with your health care provider to choose the treatment program that is best for you. Follow these instructions at home: If you are taking a newer oral anticoagulant:  Take the medicine every single day at the same time each day.  Understand what foods and drugs interact with this medicine.  Understand that there are no regular blood tests required when using this medicine.  Understand the side effects of this medicine, including excessive bruising or bleeding. Ask your health care provider or pharmacist about other possible side effects. If you are taking warfarin:  Understand how to take warfarin and know which foods can affect how warfarin works in Veterinary surgeon.  Understand that it is dangerous to take too much or too little warfarin. Too much warfarin increases the risk of bleeding. Too little warfarin continues to allow the risk for blood clots.  Follow your PT and INR blood testing schedule. The PT and INR results allow your health care provider to adjust your dose of warfarin. It is very important that you have your PT and INR tested as often as told by your health care provider.  Avoid major  changes in your diet, or tell your health care provider before you change your diet. Arrange a visit with a registered dietitian to answer your questions. Many foods, especially foods that are high in vitamin K, can interfere with warfarin and affect the PT and INR results. Eat a consistent amount of foods that are high in vitamin K, such as:  Spinach, kale, broccoli, cabbage, collard greens, turnip greens, Brussels sprouts, peas, cauliflower, seaweed, and parsley.  Beef liver and pork liver.  Green tea.  Soybean oil.  Tell your health care provider about any and all medicines, vitamins, and supplements that you take, including aspirin and other over-the-counter anti-inflammatory medicines. Be especially cautious with aspirin and anti-inflammatory medicines. Do not take those before you ask your health care provider if it is safe to do so. This is important because many medicines can interfere with warfarin and affect the PT and INR results.  Do not start or stop taking any over-the-counter or prescription medicine unless your health care provider or pharmacist tells you to do so. If you take warfarin, you will also need to do these things:  Hold pressure over cuts for longer than usual.  Tell your dentist and other health care providers that you are taking warfarin before you have any procedures in which bleeding may occur.  Avoid alcohol or drink very small amounts. Tell your health care provider if you change your alcohol intake.  Do not use tobacco products, including cigarettes, chewing tobacco, and e-cigarettes. If you need help quitting, ask your health care provider.  Avoid contact sports. General instructions  Take over-the-counter and prescription medicines only as told by your health care provider. Anticoagulant medicines can have side effects, including easy bruising and difficulty stopping bleeding. If  you are prescribed an anticoagulant, you will also need to do these  things:  Hold pressure over cuts for longer than usual.  Tell your dentist and other health care providers that you are taking anticoagulants before you have any procedures in which bleeding may occur.  Avoid contact sports.  Wear a medical alert bracelet or carry a medical alert card that says you have had a PE.  Ask your health care provider how soon you can go back to your normal activities. Stay active to prevent new blood clots from forming.  Make sure to exercise while traveling or when you have been sitting or standing for a long period of time. It is very important to exercise. Exercise your legs by walking or by tightening and relaxing your leg muscles often. Take frequent walks.  Wear compression stockings as told by your health care provider to help prevent more blood clots from forming.  Do not use tobacco products, including cigarettes, chewing tobacco, and e-cigarettes. If you need help quitting, ask your health care provider.  Keep all follow-up appointments with your health care provider. This is important. How is this prevented? Take these actions to decrease your risk of developing another DVT:  Exercise regularly. For at least 30 minutes every day, engage in:  Activity that involves moving your arms and legs.  Activity that encourages good blood flow through your body by increasing your heart rate.  Exercise your arms and legs every hour during long-distance travel (over 4 hours). Drink plenty of water and avoid drinking alcohol while traveling.  Avoid sitting or lying in bed for long periods of time without moving your legs.  Maintain a weight that is appropriate for your height. Ask your health care provider what weight is healthy for you.  If you are a woman who is over 43 years of age, avoid unnecessary use of medicines that contain estrogen. These include birth control pills.  Do not smoke, especially if you take estrogen medicines. If you need help  quitting, ask your health care provider. If you are hospitalized, prevention measures may include:  Early walking after surgery, as soon as your health care provider says that it is safe.  Receiving anticoagulants to prevent blood clots.If you cannot take anticoagulants, other options may be available, such as wearing compression stockings or using different types of devices. Get help right away if:  You have new or increased pain, swelling, or redness in an arm or leg.  You have numbness or tingling in an arm or leg.  You have shortness of breath while active or at rest.  You have chest pain.  You have a rapid or irregular heartbeat.  You feel light-headed or dizzy.  You cough up blood.  You notice blood in your vomit, bowel movement, or urine. These symptoms may represent a serious problem that is an emergency. Do not wait to see if the symptoms will go away. Get medical help right away. Call your local emergency services (911 in the [place] Do not drive yourself to the hospital.  This information is not intended to replace advice given to you by your health care provider. Make sure you discuss any questions you have with your health care provider. Document Released: 02/07/2005 Document Revised: 07/16/2015 Document Reviewed: 06/04/2014 Elsevier Interactive Patient Education  2017 [employer].

## 2016-02-04 ENCOUNTER — Telehealth: Payer: Self-pay | Admitting: Physician Assistant

## 2016-02-04 ENCOUNTER — Ambulatory Visit
Admission: RE | Admit: 2016-02-04 | Discharge: 2016-02-04 | Disposition: A | Discharge status: Home / Self Care | Payer: Medicare Other | Source: Ambulatory Visit | Attending: Physician Assistant | Admitting: Physician Assistant

## 2016-02-04 ENCOUNTER — Telehealth: Payer: Self-pay

## 2016-02-04 ENCOUNTER — Ambulatory Visit: Payer: Medicare Other | Admitting: Family Medicine

## 2016-02-04 DIAGNOSIS — R609 Edema, unspecified: Secondary | ICD-10-CM | POA: Insufficient documentation

## 2016-02-04 DIAGNOSIS — M7989 Other specified soft tissue disorders: Secondary | ICD-10-CM | POA: Diagnosis present

## 2016-02-04 DIAGNOSIS — M79661 Pain in right lower leg: Secondary | ICD-10-CM | POA: Insufficient documentation

## 2016-02-04 NOTE — Telephone Encounter (Signed)
Patient has been advised. KW 

## 2016-02-04 NOTE — Telephone Encounter (Signed)
Spoke with Dr. Rosanna Randy about patient. If patient would like to ask his siblings more about their DVTs (unprovoked) and why one is on an anticoagulant, this might help give more info about a possible hematology referral. If sibling is on anticoagulant for DVT and not a-fib, may be worth testing this patient to see if he has a clotting disorder. Thank you.

## 2016-02-04 NOTE — Telephone Encounter (Signed)
-----   Message from Trinna Post, [place] sent at 02/04/2016  9:14 AM EST ----- Doppler ultrasound showed superficial thrombophlebitis. This is a blood clot in a minor vein near the surface. This is NOT the same as a DVT, which requires anticoagulation and has a potential to travel to the lungs. We treat these with warm compresses 4x/day (can do hot towel secured with ace bandage). Can treat pain/inflammation with NSAID like ibuprofen for around three days.

## 2016-03-05 ENCOUNTER — Other Ambulatory Visit: Payer: Self-pay | Admitting: Family Medicine

## 2016-03-05 DIAGNOSIS — F411 Generalized anxiety disorder: Secondary | ICD-10-CM

## 2016-03-05 NOTE — Telephone Encounter (Signed)
Last ov 02/03/16 Last filled 03/09/15. Please review. Thank you. sd

## 2016-03-07 NOTE — Telephone Encounter (Signed)
Please review-aa 

## 2016-03-10 ENCOUNTER — Encounter: Payer: Medicare Other | Admitting: Family Medicine

## 2016-03-11 ENCOUNTER — Encounter: Payer: Medicare Other | Admitting: Family Medicine

## 2016-03-17 ENCOUNTER — Encounter: Payer: Self-pay | Admitting: Family Medicine

## 2016-03-17 ENCOUNTER — Ambulatory Visit (INDEPENDENT_AMBULATORY_CARE_PROVIDER_SITE_OTHER): Payer: Medicare Other | Admitting: Family Medicine

## 2016-03-17 VITALS — BP 110/66 | HR 60 | Temp 97.9°F | Resp 16 | Ht 73.0 in | Wt 265.2 lb

## 2016-03-17 DIAGNOSIS — H9190 Unspecified hearing loss, unspecified ear: Secondary | ICD-10-CM | POA: Insufficient documentation

## 2016-03-17 DIAGNOSIS — F411 Generalized anxiety disorder: Secondary | ICD-10-CM

## 2016-03-17 DIAGNOSIS — Z0000 Encounter for general adult medical examination without abnormal findings: Secondary | ICD-10-CM

## 2016-03-17 DIAGNOSIS — Z87442 Personal history of urinary calculi: Secondary | ICD-10-CM

## 2016-03-17 NOTE — Patient Instructions (Signed)
We will call you with the lab results. 

## 2016-03-17 NOTE — Progress Notes (Signed)
Subjective:     Patient ID: Jeff Baker, male   DOB: 08-29-1945, 71 y.o.   MRN: [id number]  HPI  Chief Complaint  Patient presents with  . Annual Exam    Patient comes in office today for his annual physical, patient reports that he is feeling well today and has no complaints or concerns. Patient states that he is eating a well balanced diet, averaging 7-7.5hrs of sleep at night and is actively exercising 3x a week. Patients last reported Tdap 12/01/15, Flu 12/2015, Prevnar 13 02/26/14, Pneumo 23 04/01/11.  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   Review of Systems General: Feeling well, Defers cholesterol screen as does not wish to be on statin drugs. HEENT: regular eye exams; does not see a dentist. Has chronic hearing loss and has been evaluated. Does not wish to spend money on hearing aids a this time. Cardiovascular: no chest pain, shortness of breath, or palpitations GI: no heartburn, no change in bowel habits. Colonoscopy recently with adenomas; has 5 year f/u. GU: nocturia x 1, no change in bladder habits. Hx of left sided kidney stone Neuro: no change in memory, defers cognitive screen Psychiatric: not depressed Musculoskeletal: no joint pain    Objective:   Physical Exam  Constitutional: He appears well-developed and well-nourished. No distress.  Eyes: PERRLA, EOMI Neck: no thyromegaly, tenderness or nodules, no cervical adenopathy or carotid bruits ENT: TM's intact without inflammation; No tonsillar enlargement or exudate, Lungs: Clear Heart : RRR without murmur or gallop Abd: bowel sounds present, soft, non-tender, no organomegaly Rectal: prostate exam deferred due to patient's body habitus Extremities: no edema Skin: no atypical lesions noted on his back.     Assessment:    1. Encounter for Medicare annual wellness exam - PSA - Comprehensive metabolic panel  2. Hearing loss, unspecified hearing loss type, unspecified  laterality  3. Anxiety, generalized: continue sertraline  4. History of kidney stones; monitor     Plan:    Further f/u pending lab work.

## 2016-03-18 ENCOUNTER — Telehealth: Payer: Self-pay

## 2016-03-18 LAB — PSA: Prostate Specific Ag, Serum: 0.7 ng/mL (ref 0.0–4.0)

## 2016-03-18 LAB — COMPREHENSIVE METABOLIC PANEL
A/G RATIO: 1.6 (ref 1.2–2.2)
ALBUMIN: 4.4 g/dL (ref 3.5–4.8)
ALK PHOS: 75 IU/L (ref 39–117)
ALT: 26 IU/L (ref 0–44)
AST: 25 IU/L (ref 0–40)
BUN / CREAT RATIO: 18 (ref 10–24)
BUN: 17 mg/dL (ref 8–27)
Bilirubin Total: 0.5 mg/dL (ref 0.0–1.2)
CALCIUM: 9.4 mg/dL (ref 8.6–10.2)
CO2: 24 mmol/L (ref 18–29)
CREATININE: 0.97 mg/dL (ref 0.76–1.27)
Chloride: 101 mmol/L (ref 96–106)
GFR calc Af Amer: 91 mL/min/{1.73_m2} (ref >59)
GFR, EST NON AFRICAN AMERICAN: 79 mL/min/{1.73_m2} (ref >59)
GLOBULIN, TOTAL: 2.7 g/dL (ref 1.5–4.5)
Glucose: 113 mg/dL — ABNORMAL HIGH (ref 65–99)
POTASSIUM: 4.8 mmol/L (ref 3.5–5.2)
SODIUM: 141 mmol/L (ref 134–144)
Total Protein: 7.1 g/dL (ref 6.0–8.5)

## 2016-03-18 NOTE — Telephone Encounter (Signed)
Patient has been advised. KW 

## 2016-03-18 NOTE — Telephone Encounter (Signed)
-----   Message from Carmon Ginsberg, [place] sent at 03/18/2016  7:32 AM EST ----- Labs look good. Mild elevation in sugar was probably that yogurt you ate.

## 2016-06-03 ENCOUNTER — Other Ambulatory Visit: Payer: Self-pay | Admitting: Family Medicine

## 2016-08-11 ENCOUNTER — Other Ambulatory Visit: Payer: Self-pay | Admitting: Family Medicine

## 2016-10-19 ENCOUNTER — Emergency Department
Admission: EM | Admit: 2016-10-19 | Discharge: 2016-10-19 | Disposition: A | Discharge status: Home / Self Care | Payer: Medicare Other | Attending: Emergency Medicine | Admitting: Emergency Medicine

## 2016-10-19 ENCOUNTER — Encounter: Payer: Self-pay | Admitting: Emergency Medicine

## 2016-10-19 DIAGNOSIS — Y929 Unspecified place or not applicable: Secondary | ICD-10-CM | POA: Diagnosis not present

## 2016-10-19 DIAGNOSIS — Z79899 Other long term (current) drug therapy: Secondary | ICD-10-CM | POA: Diagnosis not present

## 2016-10-19 DIAGNOSIS — Z87891 Personal history of nicotine dependence: Secondary | ICD-10-CM | POA: Insufficient documentation

## 2016-10-19 DIAGNOSIS — Y999 Unspecified external cause status: Secondary | ICD-10-CM | POA: Insufficient documentation

## 2016-10-19 DIAGNOSIS — Y939 Activity, unspecified: Secondary | ICD-10-CM | POA: Insufficient documentation

## 2016-10-19 DIAGNOSIS — W268XXA Contact with other sharp object(s), not elsewhere classified, initial encounter: Secondary | ICD-10-CM | POA: Diagnosis not present

## 2016-10-19 DIAGNOSIS — Z7982 Long term (current) use of aspirin: Secondary | ICD-10-CM | POA: Insufficient documentation

## 2016-10-19 DIAGNOSIS — S91311A Laceration without foreign body, right foot, initial encounter: Secondary | ICD-10-CM

## 2016-10-19 DIAGNOSIS — S90921A Unspecified superficial injury of right foot, initial encounter: Secondary | ICD-10-CM | POA: Diagnosis present

## 2016-10-19 MED ORDER — IBUPROFEN 600 MG PO TABS
600.0000 mg | ORAL_TABLET | Freq: Once | ORAL | Status: AC
  Administered 2016-10-19: 600 mg via ORAL
  Filled 2016-10-19: qty 1

## 2016-10-19 MED ORDER — SULFAMETHOXAZOLE-TRIMETHOPRIM 800-160 MG PO TABS
1.0000 | ORAL_TABLET | Freq: Once | ORAL | Status: AC
  Administered 2016-10-19: 1 via ORAL
  Filled 2016-10-19: qty 1

## 2016-10-19 MED ORDER — IBUPROFEN 600 MG PO TABS: 600.0000 mg | ORAL_TABLET | Freq: Three times a day (TID) | ORAL | 0 refills | Status: DC | PRN

## 2016-10-19 MED ORDER — BACITRACIN ZINC 500 UNIT/GM EX OINT
TOPICAL_OINTMENT | CUTANEOUS | Status: AC
  Administered 2016-10-19: 16:00:00
  Filled 2016-10-19: qty 0.9

## 2016-10-19 MED ORDER — SULFAMETHOXAZOLE-TRIMETHOPRIM 800-160 MG PO TABS: 1.0000 | ORAL_TABLET | Freq: Two times a day (BID) | ORAL | 0 refills | Status: DC

## 2016-10-19 MED ORDER — LIDOCAINE HCL (PF) 1 % IJ SOLN
INTRAMUSCULAR | Status: AC
  Administered 2016-10-19: 15:00:00
  Filled 2016-10-19: qty 5

## 2016-10-19 NOTE — ED Provider Notes (Signed)
[facility] Emergency Department Provider Note   ____________________________________________   First MD Initiated Contact with Patient 10/19/16 1436     (approximate)  I have reviewed the triage vital signs and the nursing notes.   HISTORY  Chief Complaint Laceration    HPI Jeff Baker is a 71 y.o. male patient presents with laceration to posterior right heel. Patient was cut by an ax. Patient tetanus shot is up-to-date.Patient rates the pain as a 3/10. Patient state bleeding was controlled direct pressure. Patient denies loss sensation or loss of function of the right foot.   Past Medical History:  Diagnosis Date  . 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 Allergic rhinitis   . Anxiety   . Arthritis   . Chronic scapular pain   . History of alcohol abuse   . History of hyperkalemia   . Nocturia   . Osteoarthrosis, hand   . Palpitations   . Pure hypercholesterolemia     Patient Active Problem List   Diagnosis Date Noted  . Hearing loss 03/17/2016  . Benign neoplasm of ascending colon   . Benign neoplasm of transverse colon   . History of kidney stones 03/09/2015  . Arthritis 03/06/2015  . Willing to be kidney donor 09/22/2011  . Adiposity 09/22/2011  . Pure hypercholesterolemia 03/12/2009  . Agoraphobia 12/11/2006  . Anxiety, generalized 12/11/2006  . Personal history of alcoholism ([place]) 12/11/2006  . 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 COLONOSCOPY WITH PROPOFOL N/A 04/28/2015   Procedure: COLONOSCOPY WITH PROPOFOL;  Surgeon: Lucilla Lame, MD;  Location: [facility] ENDOSCOPY;  Service: Endoscopy;  Laterality: N/A;  . NO PAST SURGERIES      Prior to Admission medications   Medication Sig Start Date End Date Taking? Authorizing Provider  aspirin EC 81 MG tablet Take by mouth.    [provider]  ibuprofen (ADVIL,MOTRIN)  600 MG tablet Take 1 tablet (600 mg total) by mouth every 8 (eight) hours as needed. 10/19/16   Sable Feil, PA-C  Multiple Vitamin (MULTIVITAMIN) capsule Take by mouth.    [provider]  sertraline (ZOLOFT) 100 MG tablet TAKE 1 TABLET (100 MG TOTAL) BY MOUTH DAILY. 03/07/16   Carmon Ginsberg, PA  sulfamethoxazole-trimethoprim (BACTRIM DS,SEPTRA DS) 800-160 MG tablet Take 1 tablet by mouth 2 (two) times daily. 10/19/16   Sable Feil, PA-C    Allergies Patient has no known allergies.  Family History  Problem Relation Age of Onset  . Dementia Mother   . Alcohol abuse Father   . Drug abuse Father     Social History Social History  Substance Use Topics  . Smoking status: Former Smoker    Types: Cigars  . Smokeless tobacco: Never Used     Comment: Gets 3 cigars on the weekends only.  . Alcohol use No     Comment: had a history of alcohol abuse been sober >31 years    Review of Systems  Constitutional: No fever/chills Eyes: No visual changes. ENT: No sore throat. Cardiovascular: Denies chest pain. Respiratory: Denies shortness of breath. Gastrointestinal: No abdominal pain.  No nausea, no vomiting.  No diarrhea.  No constipation. Genitourinary: Negative for dysuria. Musculoskeletal: Negative for back pain. Skin: Negative for rash. Neurological: Negative for headaches, focal weakness or numbness. Psychiatric:Anxiety Endocrine:Hyperlipidemia and hyperkalemia  ____________________________________________   PHYSICAL EXAM:  VITAL SIGNS: ED Triage Vitals [10/19/16  1427]  Enc Vitals Group     BP 125/72     Pulse Rate 75     Resp 15     Temp 98.1 F (36.7 C)     Temp Source Oral     SpO2 97 %     Weight 252 lb (114.3 kg)     Height 6\' 1"  (1.854 m)     Head Circumference      Peak Flow      Pain Score 3     Pain Loc      Pain Edu?      Excl. in Industry?     Constitutional: Alert and oriented. Well appearing and in no acute  distress. Hematological/Lymphatic/Immunilogical: No cervical lymphadenopathy. Cardiovascular: Normal rate, regular rhythm. Grossly normal heart sounds.  Good peripheral circulation. Respiratory: Normal respiratory effort.  No retractions. Lungs CTAB. Gastrointestinal: Soft and nontender. No distention. No abdominal bruits. No CVA tenderness. Musculoskeletal: No lower extremity tenderness nor edema.  No joint effusions. Neurologic:  Normal speech and language. No gross focal neurologic deficits are appreciated. No gait instability. Skin:  Skin is warm, dry and intact. No rash noted. Psychiatric: Mood and affect are normal. Speech and behavior are normal.  ____________________________________________   LABS (all labs ordered are listed, but only abnormal results are displayed)  Labs Reviewed - No data to display ____________________________________________  EKG   ____________________________________________  RADIOLOGY  No results found.  ____________________________________________   PROCEDURES  Procedure(s) performed:LACERATION REPAIR Performed by: Sable Feil Authorized by: Sable Feil Consent: Verbal consent obtained. Risks and benefits: risks, benefits and alternatives were discussed Consent given by: patient Patient identity confirmed: provided demographic data Prepped and Draped in normal sterile fashion Wound explored  Laceration Location: The posterior heel  Laceration Length: 3 cm  No Foreign Bodies seen or palpated  Anesthesia: local infiltration  Local anesthetic: lidocaine 1% without epinephrine  Anesthetic total: 4 ml  Irrigation method: syringe Amount of cleaning: standard  Skin closure: 3-0 nylon   Number of sutures: 8 Technique: Throat Patient tolerance: Patient tolerated the procedure well with no immediate complications.   Procedures  Critical Care performed: No  ____________________________________________   INITIAL  IMPRESSION / ASSESSMENT AND PLAN / ED COURSE  Pertinent labs & imaging results that were available during my care of the patient were reviewed by me and considered in my medical decision making (see chart for details).  Right heel laceration. Patient given discharge care instructions. Patient advised return back in 10 days for suture removal.      ____________________________________________   FINAL CLINICAL IMPRESSION(S) / ED DIAGNOSES  Final diagnoses:  Laceration of right heel without complication, initial encounter      NEW MEDICATIONS STARTED DURING THIS VISIT:  New Prescriptions   IBUPROFEN (ADVIL,MOTRIN) 600 MG TABLET    Take 1 tablet (600 mg total) by mouth every 8 (eight) hours as needed.   SULFAMETHOXAZOLE-TRIMETHOPRIM (BACTRIM DS,SEPTRA DS) 800-160 MG TABLET    Take 1 tablet by mouth 2 (two) times daily.     Note:  This document was prepared using Dragon voice recognition software and may include unintentional dictation errors.    Sable Feil, PA-C 10/19/16 1530    Harvest Dark, MD 10/22/16 [phone]

## 2016-10-19 NOTE — ED Triage Notes (Signed)
Patient presents to ED via POV from home after stepping on an axe. Patient states, "Someone didn't put it away correctly and I backed into it". Patient ambulatory to triage. Laceration noted to right foot. Last tetanus was 12/01/2015.

## 2016-10-19 NOTE — ED Notes (Signed)
See triage note  Laceration noted to right foot ambulates well  Bleeding controlled

## 2016-10-25 ENCOUNTER — Telehealth: Payer: Self-pay | Admitting: Family Medicine

## 2016-10-25 NOTE — Telephone Encounter (Signed)
Pt stated that he went to ED for a cut on his foot and was advised to take sulfamethoxazole-trimethoprim (BACTRIM DS,SEPTRA DS) 800-160 MG tablet. Pt stated that the medication makes him feel bad. Pt is requesting if he can stop the medication or if he should try something else. Pt hung up before I could get more info. Please advise. Thanks TNP

## 2016-10-25 NOTE — Telephone Encounter (Signed)
Reports wound healing well with no signs of infection. Took abx for 5 days. Ok to stop. Will document on allergy list.

## 2016-12-24 ENCOUNTER — Ambulatory Visit (INDEPENDENT_AMBULATORY_CARE_PROVIDER_SITE_OTHER): Payer: Medicare Other

## 2016-12-24 DIAGNOSIS — Z23 Encounter for immunization: Secondary | ICD-10-CM

## 2017-02-28 ENCOUNTER — Telehealth: Payer: Self-pay | Admitting: Family Medicine

## 2017-02-28 ENCOUNTER — Other Ambulatory Visit: Payer: Self-pay | Admitting: Family Medicine

## 2017-02-28 DIAGNOSIS — F411 Generalized anxiety disorder: Secondary | ICD-10-CM

## 2017-02-28 MED ORDER — SERTRALINE HCL 100 MG PO TABS: 100.0000 mg | ORAL_TABLET | Freq: Every day | ORAL | 3 refills | Status: DC

## 2017-02-28 NOTE — Telephone Encounter (Signed)
[facility] faxed a refill request for a 90-days supply for the following medication. Thanks CC ° °sertraline (ZOLOFT) 100 MG tablet  ° °

## 2017-02-28 NOTE — Telephone Encounter (Signed)
done

## 2017-02-28 NOTE — Telephone Encounter (Signed)
Last filled 03/07/16, please review. KW

## 2017-03-20 ENCOUNTER — Encounter: Payer: Self-pay | Admitting: Family Medicine

## 2017-03-20 ENCOUNTER — Ambulatory Visit (INDEPENDENT_AMBULATORY_CARE_PROVIDER_SITE_OTHER): Payer: Medicare Other | Admitting: Family Medicine

## 2017-03-20 ENCOUNTER — Other Ambulatory Visit: Payer: Self-pay | Admitting: Family Medicine

## 2017-03-20 VITALS — BP 140/74 | HR 60 | Temp 98.5°F | Resp 16 | Ht 73.0 in | Wt 255.6 lb

## 2017-03-20 DIAGNOSIS — Z0000 Encounter for general adult medical examination without abnormal findings: Secondary | ICD-10-CM | POA: Diagnosis not present

## 2017-03-20 DIAGNOSIS — Z131 Encounter for screening for diabetes mellitus: Secondary | ICD-10-CM

## 2017-03-20 DIAGNOSIS — Z23 Encounter for immunization: Secondary | ICD-10-CM | POA: Diagnosis not present

## 2017-03-20 DIAGNOSIS — Z125 Encounter for screening for malignant neoplasm of prostate: Secondary | ICD-10-CM | POA: Diagnosis not present

## 2017-03-20 DIAGNOSIS — F411 Generalized anxiety disorder: Secondary | ICD-10-CM | POA: Diagnosis not present

## 2017-03-20 NOTE — Patient Instructions (Addendum)
We will call you with the lab results. Come in for your second Shingles vaccine in > 2 months. Advance Directive Advance directives are legal documents that let you make choices ahead of time about your health care and medical treatment in case you become unable to communicate for yourself. Advance directives are a way for you to communicate your wishes to family, friends, and health care providers. This can help convey your decisions about end-of-life care if you become unable to communicate. Discussing and writing advance directives should happen over time rather than all at once. Advance directives can be changed depending on your situation and what you want, even after you have signed the advance directives. If you do not have an advance directive, some states assign family decision makers to act on your behalf based on how closely you are related to them. Each state has its own laws regarding advance directives. You may want to check with your health care provider, attorney, or state representative about the laws in your state. There are different types of advance directives, such as:  Medical power of attorney.  Living will.  Do not resuscitate (DNR) or do not attempt resuscitation (DNAR) order.  Health care proxy and medical power of attorney A health care proxy, also called a health care agent, is a person who is appointed to make medical decisions for you in cases in which you are unable to make the decisions yourself. Generally, people choose someone they know well and trust to represent their preferences. Make sure to ask this person for an agreement to act as your proxy. A proxy may have to exercise judgment in the event of a medical decision for which your wishes are not known. A medical power of attorney is a legal document that names your health care proxy. Depending on the laws in your state, after the document is written, it may also need to  be:  Signed.  Notarized.  Dated.  Copied.  Witnessed.  Incorporated into your medical record.  You may also want to appoint someone to manage your financial affairs in a situation in which you are unable to do so. This is called a durable power of attorney for finances. It is a separate legal document from the durable power of attorney for health care. You may choose the same person or someone different from your health care proxy to act as your agent in financial matters. If you do not appoint a proxy, or if there is a concern that the proxy is not acting in your best interests, a court-appointed guardian may be designated to act on your behalf. Living will A living will is a set of instructions documenting your wishes about medical care when you cannot express them yourself. Health care providers should keep a copy of your living will in your medical record. You may want to give a copy to family members or friends. To alert caregivers in case of an emergency, you can place a card in your wallet to let them know that you have a living will and where they can find it. A living will is used if you become:  Terminally ill.  Incapacitated.  Unable to communicate or make decisions.  Items to consider in your living will include:  The use or non-use of life-sustaining equipment, such as dialysis machines and breathing machines (ventilators).  A DNR or DNAR order, which is the instruction not to use cardiopulmonary resuscitation (CPR) if breathing or heartbeat stops.  The use  or non-use of tube feeding.  Withholding of food and fluids.  Comfort (palliative) care when the goal becomes comfort rather than a cure.  Organ and tissue donation.  A living will does not give instructions for distributing your money and property if you should pass away. It is recommended that you seek the advice of a lawyer when writing a will. Decisions about taxes, beneficiaries, and asset distribution will  be legally binding. This process can relieve your family and friends of any concerns surrounding disputes or questions that may come up about the distribution of your assets. DNR or DNAR A DNR or DNAR order is a request not to have CPR in the event that your heart stops beating or you stop breathing. If a DNR or DNAR order has not been made and shared, a health care provider will try to help any patient whose heart has stopped or who has stopped breathing. If you plan to have surgery, talk with your health care provider about how your DNR or DNAR order will be followed if problems occur. Summary  Advance directives are the legal documents that allow you to make choices ahead of time about your health care and medical treatment in case you become unable to communicate for yourself.  The process of discussing and writing advance directives should happen over time. You can change the advance directives, even after you have signed them.  Advance directives include DNR or DNAR orders, living wills, and designating an agent as your medical power of attorney. This information is not intended to replace advice given to you by your health care provider. Make sure you discuss any questions you have with your health care provider. Document Released: 05/17/2007 Document Revised: 12/28/2015 Document Reviewed: 12/28/2015 Elsevier Interactive Patient Education  2017 [employer].

## 2017-03-20 NOTE — Progress Notes (Signed)
Subjective:     Patient ID: Jeff Baker, male   DOB: [birth year], 72 y.o.   MRN: [id number] Chief Complaint  Patient presents with  . Annual Exam    Patient comes in office today for his annual physical he states that he feels well today and has no complaints or concerns. Patient reports that he is a vegan and follows a healthy diet, he is actively exercising up to 6x a week and sleeping on average 7-7.5hrs a night. Patient last reported Tdap 1010/17, Flu vaccine 12/24/16, Prevnar 13-02/26/14, Pneumo 23- 04/01/11, Colonoscopy screen 04/28/15. Patient is due today for labs and Shingrix vaccine.    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  Review of Systems General: Feeling well, defers lipid profile and Hepatitis C screen. HEENT: Has full upper dentures and partial lowers. Regular eye exams (glasses). Has decreased hearing but defers hearing aids due expense. Cardiovascular: no chest pain, shortness of breath, or palpitations GI: no heartburn, no change in bowel habits or blood in the stool GU: nocturia x 1-2, no change in bladder habits  Neuro: no change in memory; defers cognitive screen Psychiatric: not depressed, chronic anxiety controlled by sertraline Musculoskeletal: Left Achilles bursitis previously evaluated intermittently causes him discomfort.    Objective:   Physical Exam  Constitutional: He appears well-developed and well-nourished. No distress.  Eyes: PERRLA, EOMI Neck: no thyromegaly, tenderness or nodules, no cervical adenopathy or carotid bruits. ENT: TM's intact without inflammation; No tonsillar enlargement or exudate, Lungs: Clear Heart : RRR without murmur or gallop Abd: bowel sounds present, soft, non-tender, no organomegaly Rectal: deferred due to body habitus Extremities: no edema Skin: no atypical lesions noted on his ba     Assessment:    1. Need for shingles vaccine - Varicella-zoster vaccine  IM  2. Encounter for Medicare annual wellness exam  3. Screening for prostate cancer - PSA  4. Screening for diabetes mellitus - Comprehensive metabolic panel   5. Chronic anxiety: continue sertraline Plan:    Further f/u pending lab work.F/u in 2 months for second Shingrix vaccine. Advanced directives information provided.

## 2017-03-21 ENCOUNTER — Telehealth: Payer: Self-pay

## 2017-03-21 LAB — COMPREHENSIVE METABOLIC PANEL
ALBUMIN: 4.4 g/dL (ref 3.5–4.8)
ALK PHOS: 72 IU/L (ref 39–117)
ALT: 20 IU/L (ref 0–44)
AST: 20 IU/L (ref 0–40)
Albumin/Globulin Ratio: 1.8 (ref 1.2–2.2)
BUN/Creatinine Ratio: 14 (ref 10–24)
BUN: 14 mg/dL (ref 8–27)
Bilirubin Total: 0.6 mg/dL (ref 0.0–1.2)
CALCIUM: 9.1 mg/dL (ref 8.6–10.2)
CO2: 21 mmol/L (ref 20–29)
CREATININE: 0.99 mg/dL (ref 0.76–1.27)
Chloride: 104 mmol/L (ref 96–106)
GFR calc Af Amer: 88 mL/min/{1.73_m2} (ref >59)
GFR, EST NON AFRICAN AMERICAN: 76 mL/min/{1.73_m2} (ref >59)
GLOBULIN, TOTAL: 2.4 g/dL (ref 1.5–4.5)
GLUCOSE: 101 mg/dL — AB (ref 65–99)
Potassium: 4.4 mmol/L (ref 3.5–5.2)
SODIUM: 141 mmol/L (ref 134–144)
Total Protein: 6.8 g/dL (ref 6.0–8.5)

## 2017-03-21 LAB — PSA: Prostate Specific Ag, Serum: 1 ng/mL (ref 0.0–4.0)

## 2017-03-21 NOTE — Telephone Encounter (Signed)
-----   Message from Carmon Ginsberg, [place] sent at 03/21/2017  7:26 AM EST ----- Labs look good. Sugar is 101 lower than 113 last year. Prostate blood test is normal.

## 2017-03-21 NOTE — Telephone Encounter (Signed)
Patient was advised. KW 

## 2017-03-21 NOTE — Telephone Encounter (Signed)
Pt returned call. Thanks TNP °

## 2017-03-21 NOTE — Telephone Encounter (Signed)
[id number] 

## 2017-04-25 ENCOUNTER — Encounter: Payer: Self-pay | Admitting: Family Medicine

## 2017-04-25 ENCOUNTER — Ambulatory Visit: Payer: Medicare Other | Admitting: Family Medicine

## 2017-04-25 VITALS — BP 124/70 | HR 76 | Temp 98.1°F | Resp 16 | Wt 256.0 lb

## 2017-04-25 DIAGNOSIS — J0100 Acute maxillary sinusitis, unspecified: Secondary | ICD-10-CM

## 2017-04-25 MED ORDER — AMOXICILLIN-POT CLAVULANATE 875-125 MG PO TABS: 1.0000 | ORAL_TABLET | Freq: Two times a day (BID) | ORAL | 0 refills | Status: DC

## 2017-04-25 NOTE — Progress Notes (Signed)
Subjective:     Patient ID: Jeff Baker, male   DOB: March 31, 1945, 72 y.o.   MRN: [id number] Chief Complaint  Patient presents with  . Sinus Problem    Patient comes in office today with concerns of sinus pain and pressure under his eyes for 7 days. Patient reports that he has also suffered with congestion and runny nose, patient denies cough, fever or sore throat.    HPI Patient reports increased sinus pressure, purulent sinus drainage. Has been using decongestant nasal spray for his sx.  Review of Systems     Objective:   Physical Exam  Constitutional: He appears well-developed and well-nourished. No distress.  Ears: T.M's intact without inflammation Sinuses: mild maxillary sinus tenderness Throat: no tonsillar enlargement or exudate Neck: no cervical adenopathy Lungs: clear     Assessment:    1. Acute non-recurrent maxillary sinusitis - amoxicillin-clavulanate (AUGMENTIN) 875-125 MG tablet; Take 1 tablet by mouth 2 (two) times daily.  Dispense: 20 tablet; Refill: 0    Plan:   Discussed use of Mucinex D and coming off topical decongestant spray.

## 2017-04-25 NOTE — Patient Instructions (Signed)
Discussed use of Mucinex D for congestion. Try to come off the decongestant nasal spray.

## 2017-06-22 ENCOUNTER — Ambulatory Visit (INDEPENDENT_AMBULATORY_CARE_PROVIDER_SITE_OTHER): Payer: Medicare Other | Admitting: Family Medicine

## 2017-06-22 ENCOUNTER — Other Ambulatory Visit: Payer: Self-pay | Admitting: Family Medicine

## 2017-06-22 ENCOUNTER — Encounter: Payer: Self-pay | Admitting: Family Medicine

## 2017-06-22 VITALS — BP 122/62 | HR 61 | Temp 97.9°F | Resp 16 | Wt 251.4 lb

## 2017-06-22 DIAGNOSIS — S51851A Open bite of right forearm, initial encounter: Secondary | ICD-10-CM | POA: Diagnosis not present

## 2017-06-22 DIAGNOSIS — S61451A Open bite of right hand, initial encounter: Secondary | ICD-10-CM

## 2017-06-22 MED ORDER — SODIUM CHLORIDE 0.9 % EX SOLN
2.0000 | Freq: Two times a day (BID) | CUTANEOUS | 0 refills | Status: DC
Start: 2017-06-22 — End: 2017-10-16

## 2017-06-22 MED ORDER — AMOXICILLIN-POT CLAVULANATE 875-125 MG PO TABS: 1.0000 | ORAL_TABLET | Freq: Two times a day (BID) | ORAL | 0 refills | Status: DC

## 2017-06-22 NOTE — Progress Notes (Signed)
  Subjective:     Patient ID: Jeff Baker, male   DOB: 11/23/1945, 72 y.o.   MRN: [id number] Chief Complaint  Patient presents with  . Arm Injury    Patient comes in office today with complaint of cutt to his arm that occured last night at home. Patient states that he was cutt by glass, he states that his son is a EMT and bandaged him up. Patients last Tdap 12/01/15   HPI He recounts the event of his injury differently to me as he was concerned his dog would be removed. States his dog nearly choked to death on a Lacrosse ball. He manually had to retrieve the ball with his arm down the dog's throat. He sustained numerous puncture wounds from the dog's teeth. Reports rabies vaccine UTD.  Review of Systems     Objective:   Physical Exam  Constitutional: He appears well-developed and well-nourished. No distress.  Skin:  R forearm with ecchymosis and numerous minor lacerations and punctures. Base of thumbnail with avulsion/laceration and punctures wounds on the dorsum of his hand. Debrided loose skin around thumbnail. Per Kat wounds cleansed with Normal saline and dressings with Coban.       Assessment:    1. Animal bite of hand, right, initial encounter - amoxicillin-clavulanate (AUGMENTIN) 875-125 MG tablet; Take 1 tablet by mouth 2 (two) times daily.  Dispense: 20 tablet; Refill: 0 - SODIUM CHLORIDE, EXTERNAL, ([facility] [place]) 0.9 % SOLN; Apply 2 application topically 2 (two) times daily.  Dispense: 210 mL; Refill: 0  2. Animal bite of forearm, right, initial encounter - amoxicillin-clavulanate (AUGMENTIN) 875-125 MG tablet; Take 1 tablet by mouth 2 (two) times daily.  Dispense: 20 tablet; Refill: 0 - SODIUM CHLORIDE, EXTERNAL, ([facility] [place]) 0.9 % SOLN; Apply 2 application topically 2 (two) times daily.  Dispense: 210 mL; Refill: 0    Plan:    F/u prn chills, fever or not improving.

## 2017-06-22 NOTE — Patient Instructions (Signed)
Cleanse bites with saline twice daily and apply dressing and bandage. Let me know if fever/chills or not improving.

## 2017-10-16 ENCOUNTER — Encounter: Payer: Self-pay | Admitting: Family Medicine

## 2017-10-16 ENCOUNTER — Ambulatory Visit (INDEPENDENT_AMBULATORY_CARE_PROVIDER_SITE_OTHER): Payer: Medicare Other | Admitting: Family Medicine

## 2017-10-16 VITALS — BP 120/64 | HR 52 | Temp 97.8°F | Resp 15 | Wt 254.8 lb

## 2017-10-16 DIAGNOSIS — S20469A Insect bite (nonvenomous) of unspecified back wall of thorax, initial encounter: Secondary | ICD-10-CM | POA: Diagnosis not present

## 2017-10-16 MED ORDER — FLUOCINONIDE 0.05 % EX CREA: 1.0000 "application " | TOPICAL_CREAM | Freq: Three times a day (TID) | CUTANEOUS | 0 refills | Status: DC

## 2017-10-16 NOTE — Patient Instructions (Signed)
Let me know if the bite hasan expanding area of redness or you develop fever, chills, body aches or headache in the next 2-4 weeks.

## 2017-10-16 NOTE — Progress Notes (Signed)
  Subjective:     Patient ID: Jeff Baker, male   DOB: 1945-12-11, 72 y.o.   MRN: [id number] Chief Complaint  Patient presents with  . Tick Removal    Patient comes in office today to address tick bite on his lower back that occurred two days ago. Patient reports that he was outside and had tick pulled of with hemostats, patient has tick in ziplock bag with him today. Patient reports that site is very itchy and raised.    HPI Reports that they removed the tick promptly within 24 hours. No systemic sx. Has applied abx ointment.  Review of Systems     Objective:   Physical Exam  Constitutional: He appears well-developed and well-nourished. No distress.  Skin:  Insect bite on his midback with inflammatory flare. Tick is brought for exam and appears to be a small lone star tick.       Assessment:    1. Insect bite of back, unspecified laterality, initial encounter: rx for high potency steroid cream.    Plan:    Discussed symptoms of tick fever.

## 2017-12-08 ENCOUNTER — Ambulatory Visit (INDEPENDENT_AMBULATORY_CARE_PROVIDER_SITE_OTHER): Payer: Medicare Other | Admitting: Family Medicine

## 2017-12-08 DIAGNOSIS — Z23 Encounter for immunization: Secondary | ICD-10-CM | POA: Diagnosis not present

## 2018-01-17 ENCOUNTER — Ambulatory Visit: Payer: Medicare Other | Admitting: Family Medicine

## 2018-01-17 ENCOUNTER — Encounter: Payer: Self-pay | Admitting: Family Medicine

## 2018-01-17 VITALS — BP 132/56 | HR 64 | Temp 98.0°F | Resp 16 | Wt 260.0 lb

## 2018-01-17 DIAGNOSIS — D224 Melanocytic nevi of scalp and neck: Secondary | ICD-10-CM

## 2018-01-17 NOTE — Progress Notes (Signed)
  Subjective:     Patient ID: Jeff Baker, male   DOB: 12-06-1945, 72 y.o.   MRN: [id number] Chief Complaint  Patient presents with  . Skin Problem    Patient comes in office today to have a mole looked at on his neck. Patient states that he first noticed on his skin 5-6 weeks ago, he denies any change in color or growth in size but states that area is itchy.    HPI   Review of Systems     Objective:   Physical Exam  Constitutional: He appears well-developed and well-nourished. No distress.  Skin:  Left side of his neck with approx. 5 mm., brown, well circumscribed nevus. There is an adjacent 1-2 mm similar nevus. Procedure: frozen with cryopen x 45 seconds.       Assessment:    1. Nevus of neck: cryotherapy    Plan:    return prn.

## 2018-01-17 NOTE — Patient Instructions (Signed)
See if the mole can be easily scraped off in a few days.

## 2018-03-01 ENCOUNTER — Other Ambulatory Visit: Payer: Self-pay | Admitting: Family Medicine

## 2018-03-01 DIAGNOSIS — F411 Generalized anxiety disorder: Secondary | ICD-10-CM

## 2018-03-12 ENCOUNTER — Encounter: Payer: Self-pay | Admitting: Family Medicine

## 2018-03-12 ENCOUNTER — Ambulatory Visit (INDEPENDENT_AMBULATORY_CARE_PROVIDER_SITE_OTHER): Payer: Medicare Other | Admitting: Family Medicine

## 2018-03-12 ENCOUNTER — Other Ambulatory Visit: Payer: Self-pay

## 2018-03-12 VITALS — BP 128/73 | HR 98 | Temp 98.2°F | Ht 73.0 in | Wt 257.8 lb

## 2018-03-12 DIAGNOSIS — B349 Viral infection, unspecified: Secondary | ICD-10-CM | POA: Diagnosis not present

## 2018-03-12 NOTE — Patient Instructions (Signed)
Discussed use of Mucinex D for congestion, Delsym for cough, and Benadryl for postnasal drainage. Let me know if sinuses not improving towards the end of the week.

## 2018-03-12 NOTE — Progress Notes (Signed)
  Subjective:     Patient ID: Jeff Baker, male   DOB: [birth year], 73 y.o.   MRN: [id number] Chief Complaint  Patient presents with  . Sinusitis    since late friday 03/09/18 full sinus, sinus pressure in face, body aches, fever    HPI Reports he has been using Advil and nasal decongestant spray for his sx. +flu shot  Review of Systems     Objective:   Physical Exam Constitutional:      General: He is not in acute distress.    Appearance: He is not ill-appearing.  Neurological:     Mental Status: He is alert.   Ears: T.M's intact without inflammation Sinuses: mild maxillary and paranasal sinus tenderness Throat: no tonsillar enlargement or exudate Neck: no cervical adenopathy Lungs: clear     Assessment:    1. Acute viral syndrome     Plan:    Discussed use of Mucinex D for congestion, Delsym for cough, and Benadryl for postnasal drainage  To call if sinuses not improving over the course of the week.

## 2018-03-21 ENCOUNTER — Encounter: Payer: Medicare Other | Admitting: Family Medicine

## 2018-04-16 ENCOUNTER — Ambulatory Visit (INDEPENDENT_AMBULATORY_CARE_PROVIDER_SITE_OTHER): Payer: Medicare Other | Admitting: Family Medicine

## 2018-04-16 ENCOUNTER — Encounter: Payer: Self-pay | Admitting: Family Medicine

## 2018-04-16 VITALS — BP 126/70 | HR 57 | Temp 98.3°F | Resp 15 | Ht 73.0 in | Wt 258.4 lb

## 2018-04-16 DIAGNOSIS — Z131 Encounter for screening for diabetes mellitus: Secondary | ICD-10-CM

## 2018-04-16 DIAGNOSIS — J3089 Other allergic rhinitis: Secondary | ICD-10-CM

## 2018-04-16 DIAGNOSIS — H9193 Unspecified hearing loss, bilateral: Secondary | ICD-10-CM

## 2018-04-16 DIAGNOSIS — Z0000 Encounter for general adult medical examination without abnormal findings: Secondary | ICD-10-CM | POA: Diagnosis not present

## 2018-04-16 DIAGNOSIS — Z125 Encounter for screening for malignant neoplasm of prostate: Secondary | ICD-10-CM

## 2018-04-16 DIAGNOSIS — Z23 Encounter for immunization: Secondary | ICD-10-CM | POA: Diagnosis not present

## 2018-04-16 DIAGNOSIS — F411 Generalized anxiety disorder: Secondary | ICD-10-CM

## 2018-04-16 NOTE — Progress Notes (Signed)
  Subjective:     Patient ID: TOA MIA, male   DOB: [birth year], 73 y.o.   MRN: [id number] Chief Complaint  Patient presents with  . Annual Exam    Patient comes in office today for his annual physical, patient reports that he is feeling fairly well today but is having some sinus problems. Patient reports that he follows a well balanced diet and is actively exercising 2x a week, on average patient sleeps 7hrs a night. Patients last reported Tdap 12/01/15, Pneumo- 02/26/14, Prevnar 02/26/14, Shingrix 03/20/17, Colonoscopy 04/28/15   HPI States his allergies are acting up due to dust exposure at his place of employment. Usually relies on decongestant nasal spray but suggested he try an otc steroid nasal spray.  Review of Systems General: Feeling well. Defers lipid screen. HEENT: regular eye exams (glasses). Has denture. Has previously tested for hearing loss and recommended hearing aids. He is saving money to do this in the future. 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    Objective:   Physical Exam Constitutional:      General: He is not in acute distress. Neurological:     Mental Status: He is alert.   Eyes: PERRLA, EOMI Neck: no thyromegaly, tenderness or nodules, no cervical adenopathy or carotid bruits. ENT: TM's intact without inflammation; No tonsillar enlargement or exudate, Lungs: Clear Heart : RRR without murmur or gallop Abd: bowel sounds present, soft, non-tender, no organomegaly Rectal: Prostate deferred due to body habitus Extremities: no edema Skin: no atypical lesions noted on his back. Right posterior lower leg with flesh colored well circumscribed 0.5 cm. nevus.      Assessment:    1. Encounter for Medicare annual wellness exam  2. Anxiety, generalized: continue sertaline  3. Screening for prostate cancer - PSA  4. Screening for diabetes mellitus - Comprehensive metabolic panel  5. Need for shingles vaccine: Shingrix vaccine #2 today  6. Bilateral hearing loss, unspecified hearing loss type: pending hearing aids  7. 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

## 2018-04-16 NOTE — Patient Instructions (Addendum)
We will call you about the lab results. Consider a steroid nasal spray for allergy symptoms. Reestablish with Carles Collet PA. Best wishes for the future.

## 2018-04-17 ENCOUNTER — Telehealth: Payer: Self-pay

## 2018-04-17 LAB — COMPREHENSIVE METABOLIC PANEL
ALT: 23 IU/L (ref 0–44)
AST: 20 IU/L (ref 0–40)
Albumin/Globulin Ratio: 1.8 (ref 1.2–2.2)
Albumin: 4.3 g/dL (ref 3.7–4.7)
Alkaline Phosphatase: 74 IU/L (ref 39–117)
BUN/Creatinine Ratio: 14 (ref 10–24)
BUN: 13 mg/dL (ref 8–27)
Bilirubin Total: 0.7 mg/dL (ref 0.0–1.2)
CO2: 23 mmol/L (ref 20–29)
Calcium: 9.3 mg/dL (ref 8.6–10.2)
Chloride: 105 mmol/L (ref 96–106)
Creatinine, Ser: 0.93 mg/dL (ref 0.76–1.27)
GFR calc Af Amer: 94 mL/min/{1.73_m2} (ref >59)
GFR calc non Af Amer: 82 mL/min/{1.73_m2} (ref >59)
GLOBULIN, TOTAL: 2.4 g/dL (ref 1.5–4.5)
Glucose: 104 mg/dL — ABNORMAL HIGH (ref 65–99)
Potassium: 4.5 mmol/L (ref 3.5–5.2)
Sodium: 144 mmol/L (ref 134–144)
Total Protein: 6.7 g/dL (ref 6.0–8.5)

## 2018-04-17 LAB — PSA: Prostate Specific Ag, Serum: 0.8 ng/mL (ref 0.0–4.0)

## 2018-04-17 NOTE — Telephone Encounter (Signed)
Patient advised.KW 

## 2018-04-17 NOTE — Telephone Encounter (Signed)
-----   Message from Carmon Ginsberg, [place] sent at 04/17/2018  7:07 AM EST ----- Labs are good.

## 2018-06-18 ENCOUNTER — Ambulatory Visit (INDEPENDENT_AMBULATORY_CARE_PROVIDER_SITE_OTHER): Payer: Medicare Other | Admitting: Physician Assistant

## 2018-06-18 ENCOUNTER — Encounter: Payer: Self-pay | Admitting: Physician Assistant

## 2018-06-18 DIAGNOSIS — J0140 Acute pansinusitis, unspecified: Secondary | ICD-10-CM

## 2018-06-18 MED ORDER — AMOXICILLIN-POT CLAVULANATE 875-125 MG PO TABS: 1.0000 | ORAL_TABLET | Freq: Two times a day (BID) | ORAL | 0 refills | Status: DC

## 2018-06-18 NOTE — Patient Instructions (Signed)

## 2018-06-18 NOTE — Progress Notes (Signed)
Virtual Visit via Video Note  I connected with Jeff Baker on 06/18/18 at 11:00 AM EDT by a video enabled telemedicine application and verified that I am speaking with the correct person using two identifiers.   I discussed the limitations of evaluation and management by telemedicine and the availability of in person appointments. The patient expressed understanding and agreed to proceed.  Patient location: home Provider location: [place] involved in the visit: patient, provider   Interactive audio and video communications were attempted, although failed due to patient's inability to connect to video. Continued visit with audio only interaction with patient agreement.  Mar Daring, PA-C   Patient: Jeff Baker Male    DOB: 12-Nov-1945   73 y.o.   MRN: [id number] Visit Date: 06/18/2018  Today's Provider: Mar Daring, PA-C   Chief Complaint  Patient presents with  . URI   Subjective:    I, Porsha McClurkin CMA, am acting as a Education administrator for [employer].   URI   This is a recurrent problem. The current episode started more than 1 month ago. There has been no fever. Associated symptoms include congestion, rhinorrhea and sinus pain. Pertinent negatives include no coughing, ear pain, headaches or sore throat. He has tried antihistamine for the symptoms. The treatment provided mild relief.    Allergies  Allergen Reactions  . Bactrim [Sulfamethoxazole-Trimethoprim]     Reports mild skin rash, stomach cramps and diarrhea.     Current Outpatient Medications:  .  ibuprofen (ADVIL,MOTRIN) 600 MG tablet, Take 1 tablet (600 mg total) by mouth every 8 (eight) hours as needed., Disp: 15 tablet, Rfl: 0 .  Multiple Vitamin (MULTIVITAMIN) capsule, Take by mouth., Disp: , Rfl:  .  sertraline (ZOLOFT) 100 MG tablet, TAKE 1 TABLET BY MOUTH EVERY DAY, Disp: 90 tablet, Rfl: 3 .  fluocinonide cream (LIDEX) 4.09 %, Apply 1 application topically  3 (three) times daily. To insect bite (Patient not taking: Reported on 04/16/2018), Disp: 30 g, Rfl: 0  Review of Systems  Constitutional: Positive for fatigue. Negative for chills and fever.  HENT: Positive for congestion, postnasal drip, rhinorrhea, sinus pressure and sinus pain. Negative for ear pain and sore throat.   Respiratory: Negative for cough, chest tightness and shortness of breath.   Cardiovascular: Negative.   Neurological: Positive for light-headedness. Negative for dizziness and headaches.    Social History   Tobacco Use  . Smoking status: Former Smoker    Types: Cigars  . Smokeless tobacco: Never Used  . Tobacco comment: Gets 3 cigars on the weekends only.  Substance Use Topics  . 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 There were no vitals filed for this visit.   Physical Exam Vitals signs reviewed.  Constitutional:      General: He is not in acute distress. Pulmonary:     Effort: No respiratory distress.  Neurological:     Mental Status: He is alert.         Assessment & Plan    1. Acute non-recurrent pansinusitis Worsening symptoms that have not responded to OTC medications. Will give augmentin as below. Continue allergy medications. Stay well hydrated and get plenty of rest. Call if no symptom improvement or if symptoms worsen. - amoxicillin-clavulanate (AUGMENTIN) 875-125 MG tablet; Take  1 tablet by mouth 2 (two) times daily.  Dispense: 20 tablet; Refill: 0    I discussed the assessment and treatment plan with the patient. The patient was provided an opportunity to ask questions and all were answered. The patient agreed with the plan and demonstrated an understanding of the instructions.   The patient was advised to call back or seek an in-person evaluation if the symptoms worsen or if the condition fails to  improve as anticipated.  I provided 10 minutes of non-face-to-face time during this encounter.  Mar Daring, PA-C  [facility]

## 2018-12-05 ENCOUNTER — Ambulatory Visit (INDEPENDENT_AMBULATORY_CARE_PROVIDER_SITE_OTHER): Payer: Medicare Other

## 2018-12-05 ENCOUNTER — Telehealth: Payer: Self-pay | Admitting: Physician Assistant

## 2018-12-05 ENCOUNTER — Other Ambulatory Visit: Payer: Self-pay

## 2018-12-05 DIAGNOSIS — Z23 Encounter for immunization: Secondary | ICD-10-CM | POA: Diagnosis not present

## 2018-12-05 NOTE — Telephone Encounter (Signed)
done

## 2018-12-05 NOTE — Telephone Encounter (Signed)
Pt made Flu shot appt at 5:25 today. Needing a screening call at [phone].  Thanks, [employer]

## 2019-01-18 ENCOUNTER — Encounter: Payer: Self-pay | Admitting: Emergency Medicine

## 2019-01-18 ENCOUNTER — Other Ambulatory Visit: Payer: Self-pay

## 2019-01-18 ENCOUNTER — Ambulatory Visit
Admission: EM | Admit: 2019-01-18 | Discharge: 2019-01-18 | Disposition: A | Discharge status: Home / Self Care | Payer: Medicare Other | Attending: Emergency Medicine | Admitting: Emergency Medicine

## 2019-01-18 ENCOUNTER — Ambulatory Visit: Payer: Self-pay | Admitting: *Deleted

## 2019-01-18 DIAGNOSIS — S51852A Open bite of left forearm, initial encounter: Secondary | ICD-10-CM

## 2019-01-18 DIAGNOSIS — W540XXA Bitten by dog, initial encounter: Secondary | ICD-10-CM | POA: Diagnosis not present

## 2019-01-18 DIAGNOSIS — L03114 Cellulitis of left upper limb: Secondary | ICD-10-CM

## 2019-01-18 MED ORDER — AMOXICILLIN-POT CLAVULANATE 400-57 MG/5ML PO SUSR: 10.0000 mL | Freq: Two times a day (BID) | ORAL | 0 refills | Status: AC

## 2019-01-18 NOTE — Telephone Encounter (Signed)
  Pt called in after being bitten by his dog yesterday afternoon breaking up a dog fight between his 2 dogs.   Their shots are up to date.   Pt had a tetanus shot 12/01/2015 per his chart.  There are no openings in the office so I have referred him to the urgent care center.   He is agreeable to this plan.    See triage notes below.   Reason for Disposition . [1] Any break in skin from BITE (e.g., cut, puncture or scratch) AND[2] PET animial (e.g., dog, cat, or ferret) at risk for RABIES (e.g., sick, stray, unprovoked bite, developing country)  Answer Assessment - Initial Assessment Questions 1. ANIMAL: "What type of animal caused the bite?" "Is the injury from a bite or a claw?" If the animal is a dog or a cat, ask: "Was it a pet or a stray?" "Was it acting ill or behaving It was my dogs.     They are up to date on their shots. 6. TETANUS: "When was the last tetanus booster?"     I don't know.   7. PREGNANCY: "Is there any chance you are pregnant?" "When was your last menstrual period?"     N/A  Protocols used: ANIMAL BITE-A-AH

## 2019-01-18 NOTE — Discharge Instructions (Signed)
Enter patient in error at this time

## 2019-01-18 NOTE — ED Provider Notes (Signed)
Jeff Baker    CSN: [id number] Arrival date & time: 01/18/19  1007      History   Chief Complaint Chief Complaint  Patient presents with  . Dog bite    HPI Jeff Baker is a 73 y.o. male.   Patient presents with dog bite on his left forearm which occurred yesterday.  It is his own dog and he reports it is up-to-date on its vaccinations.  He controlled the bleeding with direct pressure yesterday and none today.  He denies drainage from the wound but reports redness on his forearm.  He denies fever or chills or other symptoms.  He has been treating the wound with rubbing alcohol and antibiotic ointment.  His last tetanus was in 2017.  The history is provided by the patient.    Past Medical History:  Diagnosis Date  . 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 Allergic rhinitis   . Anxiety   . Arthritis   . Chronic scapular pain   . History of alcohol abuse   . History of hyperkalemia   . Nocturia   . Osteoarthrosis, hand   . Palpitations   . Pure hypercholesterolemia     Patient Active Problem List   Diagnosis Date Noted  . Hearing loss 03/17/2016  . Benign neoplasm of ascending colon   . Benign neoplasm of transverse colon   . Achilles bursitis 03/13/2015  . History of kidney stones 03/09/2015  . Arthritis 03/06/2015  . Willing to be kidney donor 09/22/2011  . Adiposity 09/22/2011  . Pure hypercholesterolemia 03/12/2009  . Agoraphobia 12/11/2006  . Anxiety, generalized 12/11/2006  . Personal history of alcoholism ([place]) 12/11/2006  . 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 COLONOSCOPY WITH PROPOFOL N/A 04/28/2015   Procedure: COLONOSCOPY WITH PROPOFOL;  Surgeon: Lucilla Lame, MD;  Location: [facility] ENDOSCOPY;  Service: Endoscopy;  Laterality: N/A;  . NO PAST SURGERIES         Home Medications    Prior to Admission medications   Medication Sig  Start Date End Date Taking? Authorizing Provider  amoxicillin-clavulanate (AUGMENTIN) 400-57 MG/5ML suspension Take 10 mLs by mouth 2 (two) times daily for 10 days. 01/18/19 01/28/19  Sharion Balloon, NP  fluocinonide cream (LIDEX) [id number] % Apply 1 application topically 3 (three) times daily. To insect bite Patient not taking: Reported on 04/16/2018 10/16/17   Carmon Ginsberg, PA  ibuprofen (ADVIL,MOTRIN) 600 MG tablet Take 1 tablet (600 mg total) by mouth every 8 (eight) hours as needed. 10/19/16   Sable Feil, PA-C  Multiple Vitamin (MULTIVITAMIN) capsule Take by mouth.    [provider]  sertraline (ZOLOFT) 100 MG tablet TAKE 1 TABLET BY MOUTH EVERY DAY 03/01/18   Carmon Ginsberg, PA    Family History Family History  Problem Relation Age of Onset  . Dementia Mother   . Alcohol abuse Father   . Drug abuse Father     Social History Social History   Tobacco Use  . Smoking status: Former Smoker    Types: Cigars  . Smokeless tobacco: Never Used  . Tobacco comment: Gets 3 cigars on the weekends only.  Substance Use Topics  . 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 Drug use: No     Allergies   Bactrim [  sulfamethoxazole-trimethoprim]   Review of Systems Review of Systems  Constitutional: Negative for chills and fever.  HENT: Negative for ear pain and sore throat.   Eyes: Negative for pain and visual disturbance.  Respiratory: Negative for cough and shortness of breath.   Cardiovascular: Negative for chest pain and palpitations.  Gastrointestinal: Negative for abdominal pain and vomiting.  Genitourinary: Negative for dysuria and hematuria.  Musculoskeletal: Negative for arthralgias and back pain.  Skin: Positive for wound. Negative for color change and rash.  Neurological: Negative for seizures, syncope, weakness and numbness.  All other systems reviewed and are negative.    Physical  Exam Triage Vital Signs ED Triage Vitals  Enc Vitals Group     BP      Pulse      Resp      Temp      Temp src      SpO2      Weight      Height      Head Circumference      Peak Flow      Pain Score      Pain Loc      Pain Edu?      Excl. in [place]?    No data found.  Updated Vital Signs BP 125/69 (BP Location: Right Arm)   Pulse 76   Temp 98 F (36.7 C) (Oral)   Resp 18   Wt 250 lb (113.4 kg)   SpO2 96%   BMI 32.98 kg/m   Visual Acuity Right Eye Distance:   Left Eye Distance:   Bilateral Distance:    Right Eye Near:   Left Eye Near:    Bilateral Near:     Physical Exam Vitals signs and nursing note reviewed.  Constitutional:      Appearance: He is well-developed.  HENT:     Head: Normocephalic and atraumatic.     Mouth/Throat:     Mouth: Mucous membranes are moist.  Eyes:     Conjunctiva/sclera: Conjunctivae normal.  Neck:     Musculoskeletal: Neck supple.  Cardiovascular:     Rate and Rhythm: Normal rate and regular rhythm.     Heart sounds: No murmur.  Pulmonary:     Effort: Pulmonary effort is normal. No respiratory distress.     Breath sounds: Normal breath sounds.  Abdominal:     General: Bowel sounds are normal.     Palpations: Abdomen is soft.     Tenderness: There is no abdominal tenderness. There is no guarding or rebound.  Musculoskeletal: Normal range of motion.        General: Swelling present.  Skin:    General: Skin is warm and dry.     Capillary Refill: Capillary refill takes less than 2 seconds.     Findings: Erythema present.     Comments: Left forearm: Two puncture wounds. No drainage or bleeding.  Mild localized edema.  Erythema on forearm at wound and two streaks extending up forearm.  Neurological:     General: No focal deficit present.     Mental Status: He is alert and oriented to person, place, and time.     Sensory: No sensory deficit.     Motor: No weakness.  Psychiatric:        Mood and Affect: Mood normal.         Behavior: Behavior normal.      UC Treatments / Results  Labs (all labs ordered are listed, but only abnormal results are  displayed) Labs Reviewed - No data to display  EKG   Radiology No results found.  Procedures Procedures (including critical care time)  Medications Ordered in UC Medications - No data to display  Initial Impression / Assessment and Plan / UC Course  I have reviewed the triage vital signs and the nursing notes.  Pertinent labs & imaging results that were available during my care of the patient were reviewed by me and considered in my medical decision making (see chart for details).    Dog bite on left forearm; Cellulitis.  Patient refuses transfer to the ED.  Discussed with patient that he may likely require a higher level of care than what we can provide here.  Patient still refuses.  He states his son is a paramedic and "will take good care" of him.  Treating with Augmentin.  Strict instructions given to patient to go to the emergency department if he has increased redness or develops increased pain or new symptoms such as purulent drainage from the wound, fever, chills, other signs of worsening infection.  Discussed wound care and signs of infection at length with patient.  He agrees to this plan of care.     Final Clinical Impressions(s) / UC Diagnoses   Final diagnoses:  Dog bite of left forearm, initial encounter  Cellulitis of left upper extremity     Discharge Instructions     Take the Augmentin as directed twice a day for 10 days.    Go to the emergency department if you have increased redness or develop increased pain, puslike drainage from the wound, fever, chills, or other signs of worsening infection.        ED Prescriptions    Medication Sig Dispense Auth. Provider   amoxicillin-clavulanate (AUGMENTIN) 400-57 MG/5ML suspension Take 10 mLs by mouth 2 (two) times daily for 10 days. 200 mL Sharion Balloon, NP     PDMP not reviewed this  encounter.   Sharion Balloon, NP 01/18/19 1039

## 2019-01-18 NOTE — Discharge Instructions (Addendum)
Take the Augmentin as directed twice a day for 10 days.    Go to the emergency department if you have increased redness or develop increased pain, puslike drainage from the wound, fever, chills, or other signs of worsening infection.

## 2019-01-18 NOTE — ED Triage Notes (Signed)
Patient in office today dog bite from his dog yesterday   [id number], antibacterial ointment

## 2019-02-04 ENCOUNTER — Other Ambulatory Visit: Payer: Self-pay

## 2019-02-04 DIAGNOSIS — Z20822 Contact with and (suspected) exposure to covid-19: Secondary | ICD-10-CM

## 2019-02-07 LAB — NOVEL CORONAVIRUS, NAA: SARS-CoV-2, NAA: NOT DETECTED

## 2019-02-25 ENCOUNTER — Other Ambulatory Visit: Payer: Self-pay | Admitting: Physician Assistant

## 2019-02-25 DIAGNOSIS — F411 Generalized anxiety disorder: Secondary | ICD-10-CM

## 2019-02-25 MED ORDER — SERTRALINE HCL 100 MG PO TABS: 100.0000 mg | ORAL_TABLET | Freq: Every day | ORAL | 3 refills | Status: DC

## 2019-02-25 NOTE — Telephone Encounter (Signed)
[facility] faxed refill request for the following medications: ° °sertraline (ZOLOFT) 100 MG tablet  ° °Please advise. ° °

## 2019-03-30 ENCOUNTER — Encounter: Payer: Self-pay | Admitting: Physician Assistant

## 2019-04-04 ENCOUNTER — Encounter: Payer: Self-pay | Admitting: Physician Assistant

## 2019-04-06 ENCOUNTER — Ambulatory Visit: Payer: Medicare PPO | Attending: Internal Medicine

## 2019-04-06 DIAGNOSIS — Z23 Encounter for immunization: Secondary | ICD-10-CM | POA: Insufficient documentation

## 2019-04-06 NOTE — Progress Notes (Signed)
   Covid-19 Vaccination Clinic  Name:  Jeff Baker    MRN: [id number] DOB: 16-Apr-1945  04/06/2019  [name] was observed post Covid-19 immunization for 15 minutes without incidence. He was provided with Vaccine Information Sheet and instruction to access the V-Safe system.   [name] was instructed to call 911 with any severe reactions post vaccine: Marland Kitchen Difficulty breathing  . Swelling of your face and throat  . A fast heartbeat  . A bad rash all over your body  . Dizziness and weakness    Immunizations Administered    Name Date Dose VIS Date Route   Pfizer COVID-19 Vaccine 04/06/2019  8:47 AM 0.3 mL 02/01/2019 Intramuscular   Manufacturer: [employer]   Lot: [id number]   [place]: [id number]

## 2019-04-24 ENCOUNTER — Other Ambulatory Visit: Payer: Self-pay

## 2019-04-24 ENCOUNTER — Other Ambulatory Visit: Payer: Self-pay | Admitting: Physician Assistant

## 2019-04-24 ENCOUNTER — Ambulatory Visit (INDEPENDENT_AMBULATORY_CARE_PROVIDER_SITE_OTHER): Payer: Medicare PPO | Admitting: Physician Assistant

## 2019-04-24 ENCOUNTER — Encounter: Payer: Self-pay | Admitting: Physician Assistant

## 2019-04-24 VITALS — BP 124/77 | HR 65 | Temp 97.1°F | Ht 74.0 in | Wt 262.4 lb

## 2019-04-24 DIAGNOSIS — Z0000 Encounter for general adult medical examination without abnormal findings: Secondary | ICD-10-CM | POA: Diagnosis not present

## 2019-04-24 DIAGNOSIS — F411 Generalized anxiety disorder: Secondary | ICD-10-CM

## 2019-04-24 DIAGNOSIS — Z1159 Encounter for screening for other viral diseases: Secondary | ICD-10-CM

## 2019-04-24 DIAGNOSIS — R739 Hyperglycemia, unspecified: Secondary | ICD-10-CM

## 2019-04-24 DIAGNOSIS — E7800 Pure hypercholesterolemia, unspecified: Secondary | ICD-10-CM

## 2019-04-24 DIAGNOSIS — Z125 Encounter for screening for malignant neoplasm of prostate: Secondary | ICD-10-CM | POA: Diagnosis not present

## 2019-04-24 DIAGNOSIS — F1021 Alcohol dependence, in remission: Secondary | ICD-10-CM

## 2019-04-24 NOTE — Progress Notes (Signed)
Patient: Jeff Baker, Male    DOB: 09-19-1945, 74 y.o.   MRN: [id number] Visit Date: 04/24/2019  Today's Provider: Trinna Post, PA-C   Chief Complaint  Patient presents with  . Annual Exam   Subjective:     Complete Physical Jeff Baker is a 74 y.o. male. He feels well. He reports exercising some. He reports he is sleeping fairly well.  Lives in home with wife of 64 years. 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 Two sons. No grandchildren.   Two springer spaniels and a rescue pit bull.   Second COVID vaccine this upcoming Saturday, [place]  Patient does not want his cholesterol checked as he does not believe in the efficacy of cholesterol medicine.  ----------------------------------------------------------- Last colonoscopy:04/28/2015  Review of Systems  Constitutional: Negative.   HENT: Negative.   Eyes: Negative.   Respiratory: Negative.   Cardiovascular: Negative.   Gastrointestinal: Negative.   Endocrine: Negative.   Genitourinary: Negative.   Musculoskeletal: Negative.   Skin: Negative.   Allergic/Immunologic: Negative.   Neurological: Negative.   Hematological: Negative.   Psychiatric/Behavioral: Negative.     Social History   Socioeconomic History  . Marital status: Married    Spouse name: Not on file  . Number of children: Not on file  . Years of education: Not on file  . Highest education level: Not on file  Occupational History  . Not on file  Tobacco Use  . Smoking status: Former Smoker    Types: Cigars  . Smokeless tobacco: Never Used  . Tobacco comment: Gets 3 cigars on the weekends only.  Substance and Sexual Activity  . 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 Drug use: No  . Sexual activity: Not on file  Other Topics Concern  . Not on file  Social History Narrative  . Not on file   Social  Determinants of Health   Financial Resource Strain:   . Difficulty of Paying Living Expenses: Not on file  Food Insecurity:   . Worried About Charity fundraiser in the Last Year: Not on file  . Ran Out of Food in the Last Year: Not on file  Transportation Needs:   . Lack of Transportation (Medical): Not on file  . Lack of Transportation (Non-Medical): Not on file  Physical Activity:   . Days of Exercise per Week: Not on file  . Minutes of Exercise per Session: Not on file  Stress:   . Feeling of Stress : Not on file  Social Connections:   . Frequency of Communication with Friends and Family: Not on file  . Frequency of Social Gatherings with Friends and Family: Not on file  . Attends Religious Services: Not on file  . Active Member of Clubs or Organizations: Not on file  . Attends Archivist Meetings: Not on file  . Marital Status: Not on file  Intimate Partner Violence:   . Fear of Current or Ex-Partner: Not on file  . Emotionally Abused: Not on file  . Physically Abused: Not on file  . Sexually Abused: Not on file    Past Medical History:  Diagnosis Date  . 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 Allergic rhinitis   . Anxiety   . Arthritis   . Chronic scapular pain   . History  of alcohol abuse   . History of hyperkalemia   . Nocturia   . Osteoarthrosis, hand   . Palpitations   . Pure hypercholesterolemia      Patient Active Problem List   Diagnosis Date Noted  . Hearing loss 03/17/2016  . Benign neoplasm of ascending colon   . Benign neoplasm of transverse colon   . Achilles bursitis 03/13/2015  . History of kidney stones 03/09/2015  . Arthritis 03/06/2015  . Willing to be kidney donor 09/22/2011  . Adiposity 09/22/2011  . Pure hypercholesterolemia 03/12/2009  . Agoraphobia 12/11/2006  . Anxiety, generalized 12/11/2006  . Personal history of alcoholism ([place]) 12/11/2006  . 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 Allergic rhinitis due to dust  12/11/2006    Past Surgical History:  Procedure Laterality Date  . COLONOSCOPY WITH PROPOFOL N/A 04/28/2015   Procedure: COLONOSCOPY WITH PROPOFOL;  Surgeon: Lucilla Lame, MD;  Location: [facility] ENDOSCOPY;  Service: Endoscopy;  Laterality: N/A;  . NO PAST SURGERIES      His family history includes Alcohol abuse in his father; Dementia in his mother; Drug abuse in his father.   Current Outpatient Medications:  .  ibuprofen (ADVIL,MOTRIN) 600 MG tablet, Take 1 tablet (600 mg total) by mouth every 8 (eight) hours as needed., Disp: 15 tablet, Rfl: 0 .  Multiple Vitamin (MULTIVITAMIN) capsule, Take by mouth., Disp: , Rfl:  .  sertraline (ZOLOFT) 100 MG tablet, Take 1 tablet (100 mg total) by mouth daily., Disp: 90 tablet, Rfl: 3 .  fluocinonide cream (LIDEX) [id number]-[id number] %, Apply 1 application topically 3 (three) times daily. To insect bite (Patient not taking: Reported on 04/16/2018), Disp: 30 g, Rfl: 0  Patient Care Team: Mar Daring, PA-C as PCP - General (Family Medicine)     Objective:    Vitals: BP 124/77 (BP Location: Left Arm, Patient Position: Sitting, Cuff Size: Normal)   Pulse 65   Temp (!) 97.1 F (36.2 C) (Temporal)   Ht 6\' 2"  (1.88 m)   Wt 262 lb 6.4 oz (119 kg)   BMI 33.69 kg/m   Physical Exam Constitutional:      Appearance: Normal appearance.  Cardiovascular:     Rate and Rhythm: Normal rate and regular rhythm.     Heart sounds: Normal heart sounds.  Pulmonary:     Effort: Pulmonary effort is normal.     Breath sounds: Normal breath sounds.  Abdominal:     General: Bowel sounds are normal.     Palpations: Abdomen is soft. There is no mass.     Tenderness: There is no abdominal tenderness.  Skin:    General: Skin is warm and dry.  Neurological:     Mental Status: He is alert and oriented to person, place, and time. Mental status is at baseline.  Psychiatric:        Mood and Affect: Mood normal.        Behavior: Behavior normal.     Activities of Daily  Living In your present state of health, do you have any difficulty performing the following activities: 04/24/2019  Hearing? N  Vision? N  Difficulty concentrating or making decisions? N  Walking or climbing stairs? N  Dressing or bathing? N  Doing errands, shopping? N  Some recent data might be hidden    Fall Risk Assessment Fall Risk  04/24/2019 03/12/2018 03/20/2017 03/17/2016 03/09/2015  Falls in the past year? 0 0 No Yes Yes  Number falls in past yr: 0 - -  1 1  Comment - - - [place]  Injury with Fall? 0 - - Yes No  Comment - - - patient reports he fell in river and slipped on rocks and hurt his leg -     Depression Screen PHQ 2/9 Scores 04/24/2019 04/24/2019 04/16/2018 03/12/2018  PHQ - 2 Score 0 0 0 0  PHQ- 9 Score 1 - - -  Exception Documentation - - - -    No flowsheet data found.     Assessment & Plan:    Annual Physical Reviewed patient's Family Medical History Reviewed and updated list of patient's medical providers Assessment of cognitive impairment was done Assessed patient's functional ability Established a written schedule for health screening [place] Completed and Reviewed  Exercise Activities and Dietary recommendations Goals   None     Immunization History  Administered Date(s) Administered  . Fluad Quad(high Dose 65+) 12/05/2018  . Influenza, High Dose Seasonal PF 12/24/2016, 12/08/2017  . Influenza,inj,Quad PF,6+ Mos 03/09/2015  . Influenza-Unspecified 12/07/2013  . PFIZER SARS-COV-2 Vaccination 04/06/2019  . Pneumococcal Conjugate-13 02/26/2014  . Pneumococcal Polysaccharide-23 04/01/2011  . Tdap 12/01/2015  . Zoster Recombinat (Shingrix) 03/20/2017, 04/16/2018    Health Maintenance  Topic Date Due  . Hepatitis C Screening  10/21/1945  . COLONOSCOPY  04/27/2020  . TETANUS/TDAP  11/30/2025  . INFLUENZA VACCINE  Completed  . PNA vac Low Risk Adult  Completed     Discussed health benefits of physical  activity, and encouraged him to engage in regular exercise appropriate for his age and condition.    1. Annual physical exam   2. Personal history of alcoholism ([place])   3. Prostate cancer screening  - Comprehensive metabolic panel - PSA  4. Pure hypercholesterolemia  - Comprehensive metabolic panel  5. Hyperglycemia  Talked about reducing added sugars in diet.   - HgB A1c  6. Anxiety, generalized   7. Encounter for hepatitis C screening test for low risk patient  - Hepatitis c antibody (reflex)  The entirety of the information documented in the History of Present Illness, Review of Systems and Physical Exam were personally obtained by me. Portions of this information were initially documented by [facility] and reviewed by me for thoroughness and accuracy.   F/u 1 year CPE ------------------------------------------------------------------------------------------------------------    Trinna Post, PA-C  [facility]

## 2019-04-24 NOTE — Patient Instructions (Signed)
Health Maintenance After Age 74 After age 74, you are at a higher risk for certain long-term diseases and infections as well as injuries from falls. Falls are a major cause of broken bones and head injuries in people who are older than age 74. Getting regular preventive care can help to keep you healthy and well. Preventive care includes getting regular testing and making lifestyle changes as recommended by your health care provider. Talk with your health care provider about:  Which screenings and tests you should have. A screening is a test that checks for a disease when you have no symptoms.  A diet and exercise plan that is right for you. What should I know about screenings and tests to prevent falls? Screening and testing are the best ways to find a health problem early. Early diagnosis and treatment give you the best chance of managing medical conditions that are common after age 74. Certain conditions and lifestyle choices may make you more likely to have a fall. Your health care provider may recommend:  Regular vision checks. Poor vision and conditions such as cataracts can make you more likely to have a fall. If you wear glasses, make sure to get your prescription updated if your vision changes.  Medicine review. Work with your health care provider to regularly review all of the medicines you are taking, including over-the-counter medicines. Ask your health care provider about any side effects that may make you more likely to have a fall. Tell your health care provider if any medicines that you take make you feel dizzy or sleepy.  Osteoporosis screening. Osteoporosis is a condition that causes the bones to get weaker. This can make the bones weak and cause them to break more easily.  Blood pressure screening. Blood pressure changes and medicines to control blood pressure can make you feel dizzy.  Strength and balance checks. Your health care provider may recommend certain tests to check your  strength and balance while standing, walking, or changing positions.  Foot health exam. Foot pain and numbness, as well as not wearing proper footwear, can make you more likely to have a fall.  Depression screening. You may be more likely to have a fall if you have a fear of falling, feel emotionally low, or feel unable to do activities that you used to do.  Alcohol use screening. Using too much alcohol can affect your balance and may make you more likely to have a fall. What actions can I take to lower my risk of falls? General instructions  Talk with your health care provider about your risks for falling. Tell your health care provider if: ? You fall. Be sure to tell your health care provider about all falls, even ones that seem minor. ? You feel dizzy, sleepy, or off-balance.  Take over-the-counter and prescription medicines only as told by your health care provider. These include any supplements.  Eat a healthy diet and maintain a healthy weight. A healthy diet includes low-fat dairy products, low-fat (lean) meats, and fiber from whole grains, beans, and lots of fruits and vegetables. Home safety  Remove any tripping hazards, such as rugs, cords, and clutter.  Install safety equipment such as grab bars in bathrooms and safety rails on stairs.  Keep rooms and walkways well-lit. Activity   Follow a regular exercise program to stay fit. This will help you maintain your balance. Ask your health care provider what types of exercise are appropriate for you.  If you need a cane or   walker, use it as recommended by your health care provider.  Wear supportive shoes that have nonskid soles. Lifestyle  Do not drink alcohol if your health care provider tells you not to drink.  If you drink alcohol, limit how much you have: ? 0-1 drink a day for women. ? 0-2 drinks a day for men.  Be aware of how much alcohol is in your drink. In the [place] one drink equals one typical bottle of beer (12  oz), one-half glass of wine (5 oz), or one shot of hard liquor (1 oz).  Do not use any products that contain nicotine or tobacco, such as cigarettes and e-cigarettes. If you need help quitting, ask your health care provider. Summary  Having a healthy lifestyle and getting preventive care can help to protect your health and wellness after age 74.  Screening and testing are the best way to find a health problem early and help you avoid having a fall. Early diagnosis and treatment give you the best chance for managing medical conditions that are more common for people who are older than age 74.  Falls are a major cause of broken bones and head injuries in people who are older than age 74. Take precautions to prevent a fall at home.  Work with your health care provider to learn what changes you can make to improve your health and wellness and to prevent falls. This information is not intended to replace advice given to you by your health care provider. Make sure you discuss any questions you have with your health care provider. Document Revised: 05/31/2018 Document Reviewed: 12/21/2016 Elsevier Patient Education  2020 Elsevier Inc.  

## 2019-04-25 LAB — COMPREHENSIVE METABOLIC PANEL
ALT: 32 IU/L (ref 0–44)
AST: 26 IU/L (ref 0–40)
Albumin/Globulin Ratio: 1.8 (ref 1.2–2.2)
Albumin: 4.4 g/dL (ref 3.7–4.7)
Alkaline Phosphatase: 85 IU/L (ref 39–117)
BUN/Creatinine Ratio: 17 (ref 10–24)
BUN: 17 mg/dL (ref 8–27)
Bilirubin Total: 0.4 mg/dL (ref 0.0–1.2)
CO2: 22 mmol/L (ref 20–29)
Calcium: 9.4 mg/dL (ref 8.6–10.2)
Chloride: 105 mmol/L (ref 96–106)
Creatinine, Ser: 1.02 mg/dL (ref 0.76–1.27)
GFR calc Af Amer: 84 mL/min/{1.73_m2} (ref >59)
GFR calc non Af Amer: 73 mL/min/{1.73_m2} (ref >59)
Globulin, Total: 2.4 g/dL (ref 1.5–4.5)
Glucose: 118 mg/dL — ABNORMAL HIGH (ref 65–99)
Potassium: 4.6 mmol/L (ref 3.5–5.2)
Sodium: 141 mmol/L (ref 134–144)
Total Protein: 6.8 g/dL (ref 6.0–8.5)

## 2019-04-25 LAB — PSA: Prostate Specific Ag, Serum: 0.9 ng/mL (ref 0.0–4.0)

## 2019-04-25 LAB — HEPATITIS C ANTIBODY (REFLEX): HCV Ab: <0.1 s/co ratio (ref 0.0–0.9)

## 2019-04-25 LAB — HEMOGLOBIN A1C
Est. average glucose Bld gHb Est-mCnc: 137 mg/dL
Hgb A1c MFr Bld: 6.4 % — ABNORMAL HIGH (ref 4.8–5.6)

## 2019-04-25 LAB — HCV COMMENT:

## 2019-04-27 ENCOUNTER — Ambulatory Visit: Payer: Medicare PPO | Attending: Internal Medicine

## 2019-04-27 DIAGNOSIS — Z23 Encounter for immunization: Secondary | ICD-10-CM | POA: Insufficient documentation

## 2019-04-27 NOTE — Progress Notes (Signed)
   Covid-19 Vaccination Clinic  Name:  Jeff Baker    MRN: [id number] DOB: October 21, 1945  04/27/2019  Jeff Baker was observed post Covid-19 immunization for 15 minutes without incident. He was provided with Vaccine Information Sheet and instruction to access the V-Safe system.   Jeff Baker was instructed to call 911 with any severe reactions post vaccine: Marland Kitchen Difficulty breathing  . Swelling of face and throat  . A fast heartbeat  . A bad rash all over body  . Dizziness and weakness   Immunizations Administered    Name Date Dose VIS Date Route   Pfizer COVID-19 Vaccine 04/27/2019  8:10 AM 0.3 mL 02/01/2019 Intramuscular   Manufacturer: [place]   Lot: [id number]   [place]: [id number]

## 2019-06-10 NOTE — Progress Notes (Signed)
Subjective:   Jeff Baker is a 74 y.o. male who presents for Medicare Annual/Subsequent preventive examination.    This visit is being conducted through telemedicine due to the COVID-19 pandemic. This patient has given me verbal consent via doximity to conduct this visit, patient states they are participating from their home address. Some vital signs may be absent or patient reported.    Patient identification: identified by name, DOB, and current address  Review of Systems:  N/A  Cardiac Risk Factors include: advanced age (>26mn, >>95women);male gender     Objective:    Vitals: There were no vitals taken for this visit.  There is no height or weight on file to calculate BMI. Unable to obtain vitals due to visit being conducted via telephonically.   Advanced Directives 06/11/2019 10/19/2016 03/17/2016 02/03/2016 04/28/2015 03/09/2015  Does Patient Have a Medical Advance Directive? Yes _0   Type of AParamedicof ASteeleLiving will - - - - -  Copy of [place] Chart? No - copy requested - - - - -  Would patient like information on creating a medical advance directive? - - - - - No - patient declined information    Tobacco Social History   Tobacco Use  Smoking Status Former Smoker  . Types: Cigars  Smokeless Tobacco Never Used     Counseling given: Not Answered   Clinical Intake:  Pre-visit preparation completed: Yes  Pain : No/denies pain Pain Score: 0-No pain     Nutritional Risks: None Diabetes: No  How often do you need to have someone help you when you read instructions, pamphlets, or other written materials from your doctor or pharmacy?: 1 - Never  Interpreter Needed?: No  [facility]  Past Medical History:  Diagnosis Date  . 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 Allergic rhinitis   . Anxiety   . Arthritis   . Chronic scapular pain   . History of alcohol abuse     . History of hyperkalemia   . Nocturia   . Osteoarthrosis, hand   . Palpitations   . Pure hypercholesterolemia    Past Surgical History:  Procedure Laterality Date  . COLONOSCOPY WITH PROPOFOL N/A 04/28/2015   Procedure: COLONOSCOPY WITH PROPOFOL;  Surgeon: DLucilla Lame MD;  Location: [facility] ENDOSCOPY;  Service: Endoscopy;  Laterality: N/A;  . NO PAST SURGERIES     Family History  Problem Relation Age of Onset  . Dementia Mother   . Alcohol abuse Father   . Drug abuse Father   . Pulmonary disease Brother    Social History   Socioeconomic History  . Marital status: Married    Spouse name: Not on file  . Number of children: 2  . Years of education: Not on file  . Highest education level: Bachelor's degree (e.g., BA, AB, BS)  Occupational History  . Occupation: eSports coach   Comment: full time  Tobacco Use  . Smoking status: Former Smoker    Types: Cigars  . Smokeless tobacco: Never Used  Substance and Sexual Activity  . 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 Drug use: No  . Sexual activity: Not on file  Other Topics Concern  . Not on file  Social History Narrative  . Not on file   Social Determinants of Health  Financial Resource Strain: Low Risk   . Difficulty of Paying Living Expenses: Not hard at all  Food Insecurity: No Food Insecurity  . Worried About Charity fundraiser in the Last Year: Never true  . Ran Out of Food in the Last Year: Never true  Transportation Needs: No Transportation Needs  . Lack of Transportation (Medical): No  . Lack of Transportation (Non-Medical): No  Physical Activity: Insufficiently Active  . Days of Exercise per Week: 5 days  . Minutes of Exercise per Session: 20 min  Stress: Stress Concern Present  . Feeling of Stress : To some extent  Social Connections: Somewhat Isolated  . Frequency of Communication with Friends and Family: More than  three times a week  . Frequency of Social Gatherings with Friends and Family: More than three times a week  . Attends Religious Services: Never  . Active Member of Clubs or Organizations: No  . Attends Archivist Meetings: Never  . Marital Status: Married    Outpatient Encounter Medications as of 06/11/2019  Medication Sig  . fluocinonide cream (LIDEX) 7.61 % Apply 1 application topically 3 (three) times daily. To insect bite (Patient taking differently: Apply 1 application topically 3 (three) times daily. To insect bite PRN)  . ibuprofen (ADVIL,MOTRIN) 600 MG tablet Take 1 tablet (600 mg total) by mouth every 8 (eight) hours as needed.  . Multiple Vitamin (MULTIVITAMIN) capsule Take 1 capsule by mouth daily.   . sertraline (ZOLOFT) 100 MG tablet Take 1 tablet (100 mg total) by mouth daily.   No facility-administered encounter medications on file as of 06/11/2019.    Activities of Daily Living In your present state of health, do you have any difficulty performing the following activities: 06/11/2019 04/24/2019  Hearing? N N  Comment Wears bilateral hearing aids. -  Vision? N N  Difficulty concentrating or making decisions? N N  Walking or climbing stairs? N N  Dressing or bathing? N N  Doing errands, shopping? N N  Preparing Food and eating ? N -  Using the Toilet? N -  In the past six months, have you accidently leaked urine? N -  Do you have problems with loss of bowel control? N -  Managing your Medications? N -  Managing your Finances? N -  Housekeeping or managing your Housekeeping? N -  Some recent data might be hidden    Patient Care Team: Paulene Floor as PCP - General (Physician Assistant) [place], [facility])   Assessment:   This is a routine wellness examination for Jeff Baker.  Exercise Activities and Dietary recommendations Current Exercise Habits: Home exercise routine, Type of exercise: Other - see comments(Does Tai-Chi and fly  fishing.), Time (Minutes): 20, Frequency (Times/Week): 5, Weekly Exercise (Minutes/Week): 100, Intensity: Mild, Exercise limited by: None identified  Goals    . DIET - INCREASE WATER INTAKE     Recommend to drink at least 6-8 8oz glasses of water per day.       Fall Risk: Fall Risk  06/11/2019 04/24/2019 03/12/2018 03/20/2017 03/17/2016  Falls in the past year? 1 0 0 No Yes  Number falls in past yr: 0 0 - - 1  Comment - - - - -  Injury with Fall? 0 0 - - Yes  Comment - - - - patient reports he fell in river and slipped on rocks and hurt his leg  Follow up Falls prevention discussed - - - -    FALL RISK  PREVENTION PERTAINING TO THE HOME:  Any stairs in or around the home? No  If so, are there any without handrails? N/A  Home free of loose throw rugs in walkways, pet beds, electrical cords, etc? Yes  Adequate lighting in your home to reduce risk of falls? Yes   ASSISTIVE DEVICES UTILIZED TO PREVENT FALLS:  Life alert? No  Use of a cane, walker or w/c? Yes  Grab bars in the bathroom? Yes  Shower chair or bench in shower? Yes  Elevated toilet seat or a handicapped toilet? Yes   TIMED UP AND GO:  Was the test performed? No .    Depression Screen PHQ 2/9 Scores 06/11/2019 04/24/2019 04/24/2019 04/16/2018  PHQ - 2 Score 0 0 0 0  PHQ- 9 Score - 1 - -  Exception Documentation - - - -    Cognitive Function     6CIT Screen 06/11/2019  What Year? 0 points  What month? 0 points  What time? 0 points  Count back from 20 0 points  Months in reverse 0 points  Repeat phrase 2 points  Total Score 2    Immunization History  Administered Date(s) Administered  . Fluad Quad(high Dose 65+) 12/05/2018  . Influenza, High Dose Seasonal PF 12/24/2016, 12/08/2017  . Influenza,inj,Quad PF,6+ Mos 03/09/2015  . Influenza-Unspecified 12/07/2013  . PFIZER SARS-COV-2 Vaccination 04/06/2019, 04/27/2019  . Pneumococcal Conjugate-13 02/26/2014  . Pneumococcal Polysaccharide-23 04/01/2011  . Tdap  12/01/2015  . Zoster Recombinat (Shingrix) 03/20/2017, 04/16/2018    Qualifies for Shingles Vaccine? Completed series  Tdap: Up to date  Flu Vaccine: Up to date  Pneumococcal Vaccine: Completed series  Screening Tests Health Maintenance  Topic Date Due  . INFLUENZA VACCINE  09/22/2019  . COLONOSCOPY  04/27/2020  . TETANUS/TDAP  11/30/2025  . COVID-19 Vaccine  Completed  . Hepatitis C Screening  Completed  . PNA vac Low Risk Adult  Completed   Cancer Screenings:  Colorectal Screening: Completed 04/28/15. Repeat every 5 years.   Lung Cancer Screening: (Low Dose CT Chest recommended if Age 58-80 years, 30 pack-year currently smoking OR have quit w/in 15years.) does not qualify.   Additional Screening:  Hepatitis C Screening: Up to date  Vision Screening: Recommended annual ophthalmology exams for early detection of glaucoma and other disorders of the eye.  Dental Screening: Recommended annual dental exams for proper oral hygiene  Community Resource Referral:  CRR required this visit?  No        Plan:  I have personally reviewed and addressed the Medicare Annual Wellness questionnaire and have noted the following in the patient's chart:  A. Medical and social history B. Use of alcohol, tobacco or illicit drugs  C. Current medications and supplements D. Functional ability and status E.  Nutritional status F.  Physical activity G. Advance directives H. List of other physicians I.  Hospitalizations, surgeries, and ER visits in previous 12 months J.  [place] such as hearing and vision if needed, cognitive and depression L. Referrals and appointments   In addition, I have reviewed and discussed with patient certain preventive protocols, quality metrics, and best practice recommendations. A written personalized care plan for preventive services as well as general preventive health recommendations were provided to patient.   [place] Score,  [place]  [id number] Nurse Health Advisor   Nurse Notes: None

## 2019-06-11 ENCOUNTER — Ambulatory Visit (INDEPENDENT_AMBULATORY_CARE_PROVIDER_SITE_OTHER): Payer: Medicare PPO

## 2019-06-11 ENCOUNTER — Other Ambulatory Visit: Payer: Self-pay

## 2019-06-11 DIAGNOSIS — Z0000 Encounter for general adult medical examination without abnormal findings: Secondary | ICD-10-CM

## 2019-06-11 NOTE — Patient Instructions (Signed)
Jeff Baker , Thank you for taking time to come for your Medicare Wellness Visit. I appreciate your ongoing commitment to your health goals. Please review the following plan we discussed and let me know if I can assist you in the future.   Screening recommendations/referrals: Colonoscopy: Up to date, due 04/2020 Recommended yearly ophthalmology/optometry visit for glaucoma screening and checkup Recommended yearly dental visit for hygiene and checkup  Vaccinations: Influenza vaccine: Up to date Pneumococcal vaccine: Completed series Tdap vaccine: Up to date Shingles vaccine: Completed series    Advanced directives: Completed series  Conditions/risks identified: Recommend to drink at least 6-8 8oz glasses of water per day.  Next appointment: 06/16/20 @ 11:00 AM for an AWV. Declined scheduling a f/u with PCP at this time.   Preventive Care 70 Years and Older, Male Preventive care refers to lifestyle choices and visits with your health care provider that can promote health and wellness. What does preventive care include?  A yearly physical exam. This is also called an annual well check.  Dental exams once or twice a year.  Routine eye exams. Ask your health care provider how often you should have your eyes checked.  Personal lifestyle choices, including:  Daily care of your teeth and gums.  Regular physical activity.  Eating a healthy diet.  Avoiding tobacco and drug use.  Limiting alcohol use.  Practicing safe sex.  Taking low doses of aspirin every day.  Taking vitamin and mineral supplements as recommended by your health care provider. What happens during an annual well check? The services and screenings done by your health care provider during your annual well check will depend on your age, overall health, lifestyle risk factors, and family history of disease. Counseling  Your health care provider may ask you questions about your:  Alcohol use.  Tobacco use.  Drug  use.  Emotional well-being.  Home and relationship well-being.  Sexual activity.  Eating habits.  History of falls.  Memory and ability to understand (cognition).  Work and work Statistician. Screening  You may have the following tests or measurements:  Height, weight, and BMI.  Blood pressure.  Lipid and cholesterol levels. These may be checked every 5 years, or more frequently if you are over 54 years old.  Skin check.  Lung cancer screening. You may have this screening every year starting at age 50 if you have a 30-pack-year history of smoking and currently smoke or have quit within the past 15 years.  Fecal occult blood test (FOBT) of the stool. You may have this test every year starting at age 20.  Flexible sigmoidoscopy or colonoscopy. You may have a sigmoidoscopy every 5 years or a colonoscopy every 10 years starting at age 98.  Prostate cancer screening. Recommendations will vary depending on your family history and other risks.  Hepatitis C blood test.  Hepatitis B blood test.  Sexually transmitted disease (STD) testing.  Diabetes screening. This is done by checking your blood sugar (glucose) after you have not eaten for a while (fasting). You may have this done every 1-3 years.  Abdominal aortic aneurysm (AAA) screening. You may need this if you are a current or former smoker.  Osteoporosis. You may be screened starting at age 28 if you are at high risk. Talk with your health care provider about your test results, treatment options, and if necessary, the need for more tests. Vaccines  Your health care provider may recommend certain vaccines, such as:  Influenza vaccine. This is recommended  every year.  Tetanus, diphtheria, and acellular pertussis (Tdap, Td) vaccine. You may need a Td booster every 10 years.  Zoster vaccine. You may need this after age 21.  Pneumococcal 13-valent conjugate (PCV13) vaccine. One dose is recommended after age  23.  Pneumococcal polysaccharide (PPSV23) vaccine. One dose is recommended after age 30. Talk to your health care provider about which screenings and vaccines you need and how often you need them. This information is not intended to replace advice given to you by your health care provider. Make sure you discuss any questions you have with your health care provider. Document Released: 03/06/2015 Document Revised: 10/28/2015 Document Reviewed: 12/09/2014 Elsevier Interactive Patient Education  2017 [place] Prevention in the Home Falls can cause injuries. They can happen to people of all ages. There are many things you can do to make your home safe and to help prevent falls. What can I do on the outside of my home?  Regularly fix the edges of walkways and driveways and fix any cracks.  Remove anything that might make you trip as you walk through a door, such as a raised step or threshold.  Trim any bushes or trees on the path to your home.  Use bright outdoor lighting.  Clear any walking paths of anything that might make someone trip, such as rocks or tools.  Regularly check to see if handrails are loose or broken. Make sure that both sides of any steps have handrails.  Any raised decks and porches should have guardrails on the edges.  Have any leaves, snow, or ice cleared regularly.  Use sand or salt on walking paths during winter.  Clean up any spills in your garage right away. This includes oil or grease spills. What can I do in the bathroom?  Use night lights.  Install grab bars by the toilet and in the tub and shower. Do not use towel bars as grab bars.  Use non-skid mats or decals in the tub or shower.  If you need to sit down in the shower, use a plastic, non-slip stool.  Keep the floor dry. Clean up any water that spills on the floor as soon as it happens.  Remove soap buildup in the tub or shower regularly.  Attach bath mats securely with double-sided  non-slip rug tape.  Do not have throw rugs and other things on the floor that can make you trip. What can I do in the bedroom?  Use night lights.  Make sure that you have a light by your bed that is easy to reach.  Do not use any sheets or blankets that are too big for your bed. They should not hang down onto the floor.  Have a firm chair that has side arms. You can use this for support while you get dressed.  Do not have throw rugs and other things on the floor that can make you trip. What can I do in the kitchen?  Clean up any spills right away.  Avoid walking on wet floors.  Keep items that you use a lot in easy-to-reach places.  If you need to reach something above you, use a strong step stool that has a grab bar.  Keep electrical cords out of the way.  Do not use floor polish or wax that makes floors slippery. If you must use wax, use non-skid floor wax.  Do not have throw rugs and other things on the floor that can make you trip. What  can I do with my stairs?  Do not leave any items on the stairs.  Make sure that there are handrails on both sides of the stairs and use them. Fix handrails that are broken or loose. Make sure that handrails are as long as the stairways.  Check any carpeting to make sure that it is firmly attached to the stairs. Fix any carpet that is loose or worn.  Avoid having throw rugs at the top or bottom of the stairs. If you do have throw rugs, attach them to the floor with carpet tape.  Make sure that you have a light switch at the top of the stairs and the bottom of the stairs. If you do not have them, ask someone to add them for you. What else can I do to help prevent falls?  Wear shoes that:  Do not have high heels.  Have rubber bottoms.  Are comfortable and fit you well.  Are closed at the toe. Do not wear sandals.  If you use a stepladder:  Make sure that it is fully opened. Do not climb a closed stepladder.  Make sure that both  sides of the stepladder are locked into place.  Ask someone to hold it for you, if possible.  Clearly mark and make sure that you can see:  Any grab bars or handrails.  First and last steps.  Where the edge of each step is.  Use tools that help you move around (mobility aids) if they are needed. These include:  Canes.  Walkers.  Scooters.  Crutches.  Turn on the lights when you go into a dark area. Replace any light bulbs as soon as they burn out.  Set up your furniture so you have a clear path. Avoid moving your furniture around.  If any of your floors are uneven, fix them.  If there are any pets around you, be aware of where they are.  Review your medicines with your doctor. Some medicines can make you feel dizzy. This can increase your chance of falling. Ask your doctor what other things that you can do to help prevent falls. This information is not intended to replace advice given to you by your health care provider. Make sure you discuss any questions you have with your health care provider. Document Released: 12/04/2008 Document Revised: 07/16/2015 Document Reviewed: 03/14/2014 Elsevier Interactive Patient Education  2017 [employer].

## 2019-08-17 ENCOUNTER — Encounter: Payer: Self-pay | Admitting: Intensive Care

## 2019-08-17 ENCOUNTER — Emergency Department: Payer: Medicare PPO

## 2019-08-17 ENCOUNTER — Emergency Department
Admission: EM | Admit: 2019-08-17 | Discharge: 2019-08-17 | Disposition: A | Discharge status: Home / Self Care | Payer: Medicare PPO | Attending: Emergency Medicine | Admitting: Emergency Medicine

## 2019-08-17 ENCOUNTER — Other Ambulatory Visit: Payer: Self-pay

## 2019-08-17 DIAGNOSIS — M25512 Pain in left shoulder: Secondary | ICD-10-CM | POA: Diagnosis not present

## 2019-08-17 DIAGNOSIS — Y939 Activity, unspecified: Secondary | ICD-10-CM | POA: Diagnosis not present

## 2019-08-17 DIAGNOSIS — Z87891 Personal history of nicotine dependence: Secondary | ICD-10-CM | POA: Insufficient documentation

## 2019-08-17 DIAGNOSIS — Y92410 Unspecified street and highway as the place of occurrence of the external cause: Secondary | ICD-10-CM | POA: Insufficient documentation

## 2019-08-17 DIAGNOSIS — Y999 Unspecified external cause status: Secondary | ICD-10-CM | POA: Diagnosis not present

## 2019-08-17 DIAGNOSIS — S161XXA Strain of muscle, fascia and tendon at neck level, initial encounter: Secondary | ICD-10-CM | POA: Insufficient documentation

## 2019-08-17 DIAGNOSIS — M542 Cervicalgia: Secondary | ICD-10-CM | POA: Diagnosis present
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
## 2019-10-04 ENCOUNTER — Telehealth: Payer: Self-pay

## 2019-10-04 NOTE — Telephone Encounter (Signed)
Copied from [place] [id number]. Topic: General - Other >> Oct 04, 2019 10:58 AM Leward Quan A wrote: Reason for CRM: Patient would like a call back from Ferdinand Cava wanting to know if the Covid booster shot is available at this location please call patient at Ph# [phone]

## 2019-10-08 NOTE — Telephone Encounter (Signed)
Sent my chart message advising below

## 2019-10-14 ENCOUNTER — Telehealth: Payer: Self-pay | Admitting: Physician Assistant

## 2019-10-14 NOTE — Telephone Encounter (Signed)
He should not need a lab slip.  He just needs his A1c rechecked and this can be done in the office at that visit on 10/23/19

## 2019-10-14 NOTE — Telephone Encounter (Signed)
Called patient and advised him that we can check his A1C at his office visit on 10/23/2019 and patient agrees.

## 2019-10-14 NOTE — Telephone Encounter (Signed)
Patient wanting to know if he can have lab orders put in this week to get labs done to test glucose levels.  Would like to have appt with Adriana next week to discuss.  Please call pt back to let him know.  Thanks, [employer]

## 2019-10-15 ENCOUNTER — Ambulatory Visit: Payer: Medicare PPO | Admitting: Physician Assistant

## 2019-10-23 ENCOUNTER — Encounter: Payer: Self-pay | Admitting: Physician Assistant

## 2019-10-23 ENCOUNTER — Other Ambulatory Visit: Payer: Self-pay

## 2019-10-23 ENCOUNTER — Ambulatory Visit: Payer: Medicare PPO | Admitting: Physician Assistant

## 2019-10-23 VITALS — BP 124/75 | HR 61 | Temp 98.4°F | Ht 73.0 in | Wt 239.6 lb

## 2019-10-23 DIAGNOSIS — R7303 Prediabetes: Secondary | ICD-10-CM | POA: Diagnosis not present

## 2019-10-23 LAB — POCT GLYCOSYLATED HEMOGLOBIN (HGB A1C): Hemoglobin A1C: 5.9 % — AB (ref 4.0–5.6)

## 2019-10-23 NOTE — Patient Instructions (Signed)

## 2019-10-23 NOTE — Progress Notes (Signed)
Established patient visit   Patient: Jeff Baker   DOB: 07-18-1945   74 y.o. Male  MRN: [id number] Visit Date: 10/23/2019  Today's healthcare provider: Trinna Post, PA-C   Chief Complaint  Patient presents with  . Hyperglycemia   Subjective    HPI  Prediabetes, Follow-up  Lab Results  Component Value Date   HGBA1C 5.9 (A) 10/23/2019   HGBA1C 6.4 (H) 04/24/2019   GLUCOSE 118 (H) 04/24/2019   GLUCOSE 104 (H) 04/16/2018   GLUCOSE 101 (H) 03/20/2017    Last seen for for this6 months ago.  Management since that visit includes no changes. However, he reports that he has made significant changes in his diet and increased his exercise.  Current symptoms include none and have been stable.  Prior visit with dietician: no Current diet: well balanced Current exercise: walking, he also does [place] chi, and water rafting.   Pertinent Labs:    Component Value Date/Time   CREATININE 1.02 04/24/2019 0900    Wt Readings from Last 3 Encounters:  10/23/19 239 lb 9.6 oz (108.7 kg)  08/17/19 240 lb (108.9 kg)  04/24/19 262 lb 6.4 oz (119 kg)        Medications: Outpatient Medications Prior to Visit  Medication Sig  . fluocinonide cream (LIDEX) 1.66 % Apply 1 application topically 3 (three) times daily. To insect bite (Patient taking differently: Apply 1 application topically 3 (three) times daily. To insect bite PRN)  . ibuprofen (ADVIL,MOTRIN) 600 MG tablet Take 1 tablet (600 mg total) by mouth every 8 (eight) hours as needed.  . Multiple Vitamin (MULTIVITAMIN) capsule Take 1 capsule by mouth daily.   . sertraline (ZOLOFT) 100 MG tablet Take 1 tablet (100 mg total) by mouth daily.   No facility-administered medications prior to visit.    Review of Systems  Constitutional: Negative.   Respiratory: Negative.   Cardiovascular: Negative.   Endocrine: Negative.   Musculoskeletal: Negative.   Neurological: Negative.       Objective    BP 124/75   Pulse 61    Temp 98.4 F (36.9 C)   Ht 6\' 1"  (1.854 m)   Wt 239 lb 9.6 oz (108.7 kg)   BMI 31.61 kg/m    Physical Exam Constitutional:      Appearance: Normal appearance.  Cardiovascular:     Rate and Rhythm: Normal rate and regular rhythm.     Pulses: Normal pulses.     Heart sounds: Normal heart sounds.  Pulmonary:     Effort: Pulmonary effort is normal.     Breath sounds: Normal breath sounds.  Skin:    General: Skin is warm and dry.  Neurological:     Mental Status: He is alert and oriented to person, place, and time. Mental status is at baseline.  Psychiatric:        Mood and Affect: Mood normal.        Behavior: Behavior normal.       Results for orders placed or performed in visit on 10/23/19  POCT glycosylated hemoglobin (Hb A1C)  Result Value Ref Range   Hemoglobin A1C 5.9 (A) 4.0 - 5.6 %   HbA1c POC (<> result, manual entry)     HbA1c, POC (prediabetic range)     HbA1c, POC (controlled diabetic range)      Assessment & Plan    1. Prediabetes  Much better, A1c is 5.9%. Patient has made significant changes to lifestyle and diet.   -  POCT glycosylated hemoglobin (Hb A1C)    Return in about 6 months (around 04/21/2020).      ITrinna Post, PA-C, have reviewed all documentation for this visit. The documentation on 10/23/19 for the exam, diagnosis, procedures, and orders are all accurate and complete.  The entirety of the information documented in the History of Present Illness, Review of Systems and Physical Exam were personally obtained by me. Portions of this information were initially documented by Wilburt Finlay, CMA and reviewed by me for thoroughness and accuracy.   I spent 20 minutes dedicated to the care of this patient on the date of this encounter to include pre-visit review of records, face-to-face time with the patient discussing prediabetes, and post visit ordering of testing.    Paulene Floor  [facility] [phone]  (phone) [phone] (fax)  [place]

## 2019-12-30 ENCOUNTER — Ambulatory Visit: Payer: Medicare PPO | Admitting: Physician Assistant

## 2019-12-30 ENCOUNTER — Other Ambulatory Visit: Payer: Self-pay

## 2019-12-30 ENCOUNTER — Ambulatory Visit
Admission: RE | Admit: 2019-12-30 | Discharge: 2019-12-30 | Disposition: A | Discharge status: Home / Self Care | Payer: Medicare PPO | Source: Ambulatory Visit | Attending: Physician Assistant | Admitting: Physician Assistant

## 2019-12-30 ENCOUNTER — Ambulatory Visit
Admission: RE | Admit: 2019-12-30 | Discharge: 2019-12-30 | Disposition: A | Discharge status: Home / Self Care | Payer: Medicare PPO | Attending: Physician Assistant | Admitting: Physician Assistant

## 2019-12-30 ENCOUNTER — Encounter: Payer: Self-pay | Admitting: Physician Assistant

## 2019-12-30 VITALS — BP 105/57 | HR 64 | Temp 98.3°F | Wt 244.6 lb

## 2019-12-30 DIAGNOSIS — R109 Unspecified abdominal pain: Secondary | ICD-10-CM | POA: Insufficient documentation

## 2019-12-30 DIAGNOSIS — N132 Hydronephrosis with renal and ureteral calculous obstruction: Secondary | ICD-10-CM

## 2019-12-30 DIAGNOSIS — Z23 Encounter for immunization: Secondary | ICD-10-CM | POA: Diagnosis not present

## 2019-12-30 MED ORDER — TAMSULOSIN HCL 0.4 MG PO CAPS: 0.4000 mg | ORAL_CAPSULE | Freq: Every day | ORAL | 0 refills | Status: DC

## 2019-12-30 MED ORDER — KETOROLAC TROMETHAMINE 30 MG/ML IJ SOLN: 30.0000 mg | Freq: Once | INTRAMUSCULAR | Status: DC

## 2019-12-30 MED ORDER — KETOROLAC TROMETHAMINE 60 MG/2ML IM SOLN
60.0000 mg | Freq: Once | INTRAMUSCULAR | Status: AC
  Administered 2019-12-30: 30 mg via INTRAMUSCULAR

## 2019-12-30 NOTE — Patient Instructions (Signed)

## 2019-12-30 NOTE — Progress Notes (Signed)
Established patient visit   Patient: Jeff Baker   DOB: [birth year]   74 y.o. Male  MRN: [id number] Visit Date: 12/30/2019  Today's healthcare provider: Trinna Post, PA-C   Chief Complaint  Patient presents with  . Abdominal Pain  . Back Pain   Subjective    Back Pain This is a new problem. The current episode started in the past 7 days. The problem occurs intermittently. The problem has been gradually improving since onset. The pain is present in the lumbar spine. The pain is at a severity of 4/10. The pain is mild. The pain is the same all the time. Associated symptoms include abdominal pain. Pertinent negatives include no chest pain, headaches, numbness, pelvic pain, tingling or weakness. He has tried NSAIDs and heat for the symptoms. The treatment provided mild relief.     Patient reports sharp 6-7/10 pain starting Friday under his back left rib. He reports the pain is intermittent and has moved towards his abdomen. The pain lasts 8-10 hours. Denies fevers. Reports some mild nausea. Denies vomiting. Reports constipation. Last bowel movement Saturday. Denies dysuria. Denies blood in urine. He has a history of two 3 mm renal stones diagnosed 2016 on left side during lumbar CT. Denies falls or injuries to the area. He has never passed a kidney stone to his knowledge.      Medications: Outpatient Medications Prior to Visit  Medication Sig  . fluocinonide cream (LIDEX) 7.61 % Apply 1 application topically 3 (three) times daily. To insect bite (Patient taking differently: Apply 1 application topically 3 (three) times daily. To insect bite PRN)  . ibuprofen (ADVIL,MOTRIN) 600 MG tablet Take 1 tablet (600 mg total) by mouth every 8 (eight) hours as needed.  . Multiple Vitamin (MULTIVITAMIN) capsule Take 1 capsule by mouth daily.   . sertraline (ZOLOFT) 100 MG tablet Take 1 tablet (100 mg total) by mouth daily.   No facility-administered medications prior to visit.     Review of Systems  Constitutional: Negative.   Respiratory: Negative.   Cardiovascular: Negative for chest pain.  Gastrointestinal: Positive for abdominal pain.  Genitourinary: Positive for frequency. Negative for decreased urine volume, difficulty urinating, flank pain, pelvic pain and urgency.  Musculoskeletal: Positive for back pain.  Neurological: Negative for tingling, weakness, numbness and headaches.      Objective    BP (!) 105/57 (BP Location: Right Arm, Patient Position: Sitting, Cuff Size: Large)   Pulse 64   Temp 98.3 F (36.8 C) (Oral)   Wt 244 lb 9.6 oz (110.9 kg)   SpO2 100%   BMI 32.27 kg/m    Physical Exam Constitutional:      Appearance: Normal appearance. He is obese.  Cardiovascular:     Rate and Rhythm: Normal rate and regular rhythm.     Heart sounds: Normal heart sounds.  Pulmonary:     Effort: Pulmonary effort is normal.     Breath sounds: Normal breath sounds.  Abdominal:     General: Abdomen is flat. Bowel sounds are normal.     Palpations: Abdomen is soft.     Tenderness: There is no abdominal tenderness. There is no right CVA tenderness or left CVA tenderness.  Skin:    General: Skin is warm and dry.  Neurological:     General: No focal deficit present.     Mental Status: He is alert and oriented to person, place, and time. Mental status is at baseline.  Psychiatric:  Mood and Affect: Mood normal.        Behavior: Behavior normal.       No results found for any visits on 12/30/19.  Assessment & Plan    1. Flank pain  Patient unable to give urine sample today. Treated empirically for kidney stone and KUB showed a 3 mm stone in left kidney but no ureteral stones. Patient messaged back through MyChart with increased pain and dysuria. Will cover for UTI/pyelo with cipro and added labs as below. Will proceed with CT scan.   - tamsulosin (FLOMAX) 0.4 MG CAPS capsule; Take 1 capsule (0.4 mg total) by mouth daily.  Dispense: 30  capsule; Refill: 0 - DG Abd 1 View; Future - ketorolac (TORADOL) injection 60 mg - CBC with Differential - Comprehensive Metabolic Panel (CMET) - CULTURE, URINE COMPREHENSIVE - ciprofloxacin (CIPRO) 500 MG tablet; Take 1 tablet (500 mg total) by mouth 2 (two) times daily for 7 days.  Dispense: 14 tablet; Refill: 0 - CT RENAL STONE STUDY  2. Abdominal pain, unspecified abdominal location  - DG Abd 1 View; Future - ketorolac (TORADOL) injection 60 mg - CBC with Differential - Comprehensive Metabolic Panel (CMET) - CULTURE, URINE COMPREHENSIVE - CT RENAL STONE STUDY  3. Need for influenza vaccination  - Flu Vaccine QUAD High Dose(Fluad)       I, Trinna Post, PA-C, have reviewed all documentation for this visit. The documentation on 01/01/20 for the exam, diagnosis, procedures, and orders are all accurate and complete.  The entirety of the information documented in the History of Present Illness, Review of Systems and Physical Exam were personally obtained by me. Portions of this information were initially documented by [facility] and reviewed by me for thoroughness and accuracy.     Paulene Floor  [facility] [phone] (phone) [phone] (fax)  [place]

## 2019-12-31 ENCOUNTER — Encounter: Payer: Self-pay | Admitting: Physician Assistant

## 2020-01-01 ENCOUNTER — Telehealth: Payer: Self-pay | Admitting: Physician Assistant

## 2020-01-01 ENCOUNTER — Encounter: Payer: Self-pay | Admitting: Physician Assistant

## 2020-01-01 DIAGNOSIS — N390 Urinary tract infection, site not specified: Secondary | ICD-10-CM

## 2020-01-01 MED ORDER — CIPROFLOXACIN HCL 500 MG PO TABS: 500.0000 mg | ORAL_TABLET | Freq: Two times a day (BID) | ORAL | 0 refills | Status: DC

## 2020-01-01 MED ORDER — LEVOFLOXACIN 750 MG PO TABS: 750.0000 mg | ORAL_TABLET | Freq: Every day | ORAL | 0 refills | Status: AC

## 2020-01-01 NOTE — Telephone Encounter (Signed)
Levofloxacin sent in because cipro is on back order.

## 2020-01-02 ENCOUNTER — Encounter: Payer: Self-pay | Admitting: Physician Assistant

## 2020-01-02 LAB — CBC WITH DIFFERENTIAL/PLATELET
Basophils Absolute: 0.1 10*3/uL (ref 0.0–0.2)
Basos: 1 %
EOS (ABSOLUTE): 0.3 10*3/uL (ref 0.0–0.4)
Eos: 4 %
Hematocrit: 38.5 % (ref 37.5–51.0)
Hemoglobin: 13 g/dL (ref 13.0–17.7)
Immature Grans (Abs): 0 10*3/uL (ref 0.0–0.1)
Immature Granulocytes: 0 %
Lymphocytes Absolute: 1.7 10*3/uL (ref 0.7–3.1)
Lymphs: 25 %
MCH: 30 pg (ref 26.6–33.0)
MCHC: 33.8 g/dL (ref 31.5–35.7)
MCV: 89 fL (ref 79–97)
Monocytes Absolute: 0.8 10*3/uL (ref 0.1–0.9)
Monocytes: 12 %
Neutrophils Absolute: 4 10*3/uL (ref 1.4–7.0)
Neutrophils: 58 %
Platelets: 219 10*3/uL (ref 150–450)
RBC: 4.33 x10E6/uL (ref 4.14–5.80)
RDW: 12.6 % (ref 11.6–15.4)
WBC: 6.8 10*3/uL (ref 3.4–10.8)

## 2020-01-02 LAB — COMPREHENSIVE METABOLIC PANEL
ALT: 18 IU/L (ref 0–44)
AST: 18 IU/L (ref 0–40)
Albumin/Globulin Ratio: 1.7 (ref 1.2–2.2)
Albumin: 4 g/dL (ref 3.7–4.7)
Alkaline Phosphatase: 74 IU/L (ref 44–121)
BUN/Creatinine Ratio: 20 (ref 10–24)
BUN: 17 mg/dL (ref 8–27)
Bilirubin Total: 0.6 mg/dL (ref 0.0–1.2)
CO2: 26 mmol/L (ref 20–29)
Calcium: 9 mg/dL (ref 8.6–10.2)
Chloride: 107 mmol/L — ABNORMAL HIGH (ref 96–106)
Creatinine, Ser: 0.83 mg/dL (ref 0.76–1.27)
GFR calc Af Amer: 100 mL/min/{1.73_m2} (ref >59)
GFR calc non Af Amer: 87 mL/min/{1.73_m2} (ref >59)
Globulin, Total: 2.3 g/dL (ref 1.5–4.5)
Glucose: 100 mg/dL — ABNORMAL HIGH (ref 65–99)
Potassium: 4.8 mmol/L (ref 3.5–5.2)
Sodium: 143 mmol/L (ref 134–144)
Total Protein: 6.3 g/dL (ref 6.0–8.5)

## 2020-01-03 ENCOUNTER — Telehealth: Payer: Self-pay

## 2020-01-03 NOTE — Telephone Encounter (Signed)
Patient was advised.  

## 2020-01-03 NOTE — Telephone Encounter (Signed)
-----   Message from Mar Daring, [place] sent at 01/03/2020  4:17 PM EST ----- Blood count is normal. Kidney and liver function are normal. Sugar is ok reading. Sodium, potassium, and calcium are normal. Urine culture is negative.

## 2020-01-04 LAB — CULTURE, URINE COMPREHENSIVE

## 2020-01-20 ENCOUNTER — Ambulatory Visit
Admission: RE | Admit: 2020-01-20 | Discharge: 2020-01-20 | Disposition: A | Discharge status: Home / Self Care | Payer: Medicare PPO | Source: Ambulatory Visit | Attending: Physician Assistant | Admitting: Physician Assistant

## 2020-01-20 ENCOUNTER — Other Ambulatory Visit: Payer: Self-pay

## 2020-01-20 DIAGNOSIS — R109 Unspecified abdominal pain: Secondary | ICD-10-CM | POA: Insufficient documentation

## 2020-01-21 ENCOUNTER — Other Ambulatory Visit: Payer: Self-pay | Admitting: Physician Assistant

## 2020-01-21 DIAGNOSIS — R109 Unspecified abdominal pain: Secondary | ICD-10-CM

## 2020-01-21 NOTE — Telephone Encounter (Signed)
Requested medication (s) are due for refill today: yes  Requested medication (s) are on the active medication list: yes  Last refill:  12/30/19 #30  Future visit scheduled: No  Notes to clinic:  Please review for refill. Unsure if refill is appropriate. Patient was only given a 30 day supply with no refills on last visit    Requested Prescriptions  Pending Prescriptions Disp Refills   tamsulosin (FLOMAX) 0.4 MG CAPS capsule [Pharmacy Med Name: TAMSULOSIN HCL 0.4 MG CAPSULE] 30 capsule 0    Sig: TAKE 1 [place]      Urology: Alpha-Adrenergic Blocker Passed - 01/21/2020 11:31 AM      Passed - Last BP in normal range    BP Readings from Last 1 Encounters:  12/30/19 (!) 105/57          Passed - Valid encounter within last 12 months    Recent Outpatient Visits           3 weeks ago Flank pain   [place], [place], [place]   3 months ago Prediabetes   [facility] Carles Collet M, [place]   9 months ago Annual physical exam   [place], Wendee Beavers, [place]   1 year ago Acute non-recurrent pansinusitis   [facility] [place], Clearnce Sorrel, [place]   1 year ago Encounter for [employer] annual wellness exam   [place], [place]

## 2020-01-21 NOTE — Addendum Note (Signed)
Addended by: Trinna Post on: 01/21/2020 08:10 AM   Modules accepted: Orders

## 2020-01-22 ENCOUNTER — Other Ambulatory Visit: Payer: Self-pay | Admitting: Radiology

## 2020-01-22 ENCOUNTER — Ambulatory Visit
Admission: RE | Admit: 2020-01-22 | Discharge: 2020-01-22 | Disposition: A | Discharge status: Home / Self Care | Payer: Medicare PPO | Source: Ambulatory Visit | Attending: Urology | Admitting: Urology

## 2020-01-22 ENCOUNTER — Ambulatory Visit: Payer: Medicare PPO | Admitting: Urology

## 2020-01-22 ENCOUNTER — Encounter: Payer: Self-pay | Admitting: Urology

## 2020-01-22 ENCOUNTER — Other Ambulatory Visit: Payer: Self-pay

## 2020-01-22 ENCOUNTER — Other Ambulatory Visit
Admission: RE | Admit: 2020-01-22 | Discharge: 2020-01-22 | Disposition: A | Discharge status: Home / Self Care | Payer: Medicare PPO | Source: Ambulatory Visit | Admitting: Urology

## 2020-01-22 VITALS — BP 145/74 | HR 64 | Ht 72.0 in | Wt 244.0 lb

## 2020-01-22 DIAGNOSIS — Z01818 Encounter for other preprocedural examination: Secondary | ICD-10-CM | POA: Diagnosis not present

## 2020-01-22 DIAGNOSIS — N202 Calculus of kidney with calculus of ureter: Secondary | ICD-10-CM | POA: Insufficient documentation

## 2020-01-22 DIAGNOSIS — N201 Calculus of ureter: Secondary | ICD-10-CM

## 2020-01-22 DIAGNOSIS — Z20822 Contact with and (suspected) exposure to covid-19: Secondary | ICD-10-CM | POA: Insufficient documentation

## 2020-01-22 LAB — SARS CORONAVIRUS 2 (TAT 6-24 HRS): SARS Coronavirus 2: NEGATIVE

## 2020-01-22 MED ORDER — DIAZEPAM 5 MG PO TABS: 10.0000 mg | ORAL_TABLET | ORAL | Status: AC

## 2020-01-22 MED ORDER — DIPHENHYDRAMINE HCL 25 MG PO CAPS: 25.0000 mg | ORAL_CAPSULE | ORAL | Status: DC

## 2020-01-22 MED ORDER — CEPHALEXIN 500 MG PO CAPS: 500.0000 mg | ORAL_CAPSULE | ORAL | Status: AC

## 2020-01-22 MED ORDER — SODIUM CHLORIDE 0.9 % IV SOLN: INTRAVENOUS | Status: DC

## 2020-01-22 NOTE — H&P (View-Only) (Signed)
01/22/2020 2:38 PM   Jeff Baker 29-Apr-1945 [id number]  Referring provider: Trinna Post, PA-C [address] [place],  [place] [address]  Chief Complaint  Patient presents with  . Nephrolithiasis    New Patient    HPI: 74 year old male who presents today for further evaluation of left flank pain and left nephrolithiasis.  He is seen and evaluated by his primary care physician for left flank pain in early November.  He underwent CT stone protocol 01/20/2020 which showed several small nonobstructing left-sided stones measuring up to 5 mm as well as left distal ureteral Steinstrasse with multiple tiny stones measuring up to 13 mm in length.  He was also checked for infection on 01/01/2020 which the urine culture was negative.  He was treated presumptively for possible UTI as a precaution.  He denies of having an acute stone episode.  He does have stones that have "set up shop" in his kidney on the left side for many years which she is known about but never passed.  Today, he is having intermittent left flank pain.  He does feel like this is lower over the past several weeks than previously but persistent which comes and goes.  No significant urinary symptoms.  No gross hematuria.   PMH: Past Medical History:  Diagnosis Date  . 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 Allergic rhinitis   . Anxiety   . Arthritis   . Chronic scapular pain   . History of alcohol abuse   . History of hyperkalemia   . Nocturia   . Osteoarthrosis, hand   . Palpitations   . Pure hypercholesterolemia     Surgical History: Past Surgical History:  Procedure Laterality Date  . COLONOSCOPY WITH PROPOFOL N/A 04/28/2015   Procedure: COLONOSCOPY WITH PROPOFOL;  Surgeon: Lucilla Lame, MD;  Location: [facility] ENDOSCOPY;  Service: Endoscopy;  Laterality: N/A;  . NO PAST SURGERIES      Home Medications:  Allergies as of 01/22/2020      Reactions   Bactrim [sulfamethoxazole-trimethoprim]     Reports mild skin rash, stomach cramps and diarrhea.      Medication List       Accurate as of January 22, 2020  2:38 PM. If you have any questions, ask your nurse or doctor.        STOP taking these medications   tamsulosin 0.4 MG Caps capsule Commonly known as: FLOMAX Stopped by: Hollice Espy, MD     TAKE these medications   fluocinonide cream 0.05 % Commonly known as: LIDEX Apply 1 application topically 3 (three) times daily. To insect bite What changed: additional instructions   ibuprofen 600 MG tablet Commonly known as: ADVIL Take 1 tablet (600 mg total) by mouth every 8 (eight) hours as needed.   multivitamin capsule Take 1 capsule by mouth daily.   sertraline 100 MG tablet Commonly known as: ZOLOFT Take 1 tablet (100 mg total) by mouth daily.       Allergies:  Allergies  Allergen Reactions  . Bactrim [Sulfamethoxazole-Trimethoprim]     Reports mild skin rash, stomach cramps and diarrhea.    Family History: Family History  Problem Relation Age of Onset  . Dementia Mother   . Alcohol abuse Father   . Drug abuse Father   . Pulmonary disease Brother   . Prostate cancer Neg Hx   . Bladder Cancer Neg Hx   . Kidney cancer Neg Hx     Social History:  reports that  he has quit smoking. His smoking use included cigars. He has never used smokeless tobacco. He reports that he does not drink alcohol and does not use drugs.   Physical Exam: BP (!) 145/74   Pulse 64   Ht 6' (1.829 m)   Wt 244 lb (110.7 kg)   BMI 33.09 kg/m   Constitutional:  Alert and oriented, No acute distress. HEENT: [place] AT, moist mucus membranes.  Trachea midline, no masses. Cardiovascular: No clubbing, cyanosis, or edema. Respiratory: Normal respiratory effort, no increased work of breathing. Skin: No rashes, bruises or suspicious lesions. Neurologic: Grossly intact, no focal deficits, moving all 4 extremities. Psychiatric: Normal mood and affect.  Laboratory Data: Lab Results   Component Value Date   WBC 6.8 01/01/2020   HGB 13.0 01/01/2020   HCT 38.5 01/01/2020   MCV 89 01/01/2020   PLT 219 01/01/2020    Lab Results  Component Value Date   CREATININE 0.83 01/01/2020    Lab Results  Component Value Date   HGBA1C 5.9 (A) 10/23/2019    Urinalysis Pending  Pertinent Imaging: Results for orders placed during the hospital encounter of 12/30/19  DG Abd 1 View  Narrative CLINICAL DATA:  Left flank pain. Frequent urination. History of left kidney stone.  EXAM: ABDOMEN - 1 VIEW  COMPARISON:  03/09/2015  FINDINGS: Bowel gas pattern is normal without evidence of ileus, obstruction or apparent free air. 3 mm stone in the lower pole left kidney. No evidence of passing stone. Small phleboliths in the pelvis. Ordinary degenerative changes affect lumbar spine and left hip.  IMPRESSION: 3 mm stone in the lower pole left kidney. No evidence of passing stone.   Electronically Signed By: Nelson Chimes M.D. On: 12/31/2019 15:58   Results for orders placed in visit on 12/30/19  CT RENAL STONE STUDY  Narrative CLINICAL DATA:  74 year old male with left flank pain. Concern for kidney stone.  EXAM: CT ABDOMEN AND PELVIS WITHOUT CONTRAST  TECHNIQUE: Multidetector CT imaging of the abdomen and pelvis was performed following the standard protocol without IV contrast.  COMPARISON:  None.  FINDINGS: Evaluation of this exam is limited in the absence of intravenous contrast.  Lower chest: The visualized lung bases are clear.  No intra-abdominal free air or free fluid.  Hepatobiliary: The liver is unremarkable. No intrahepatic biliary ductal dilatation. The gallbladder is unremarkable.  Pancreas: Unremarkable. No pancreatic ductal dilatation or surrounding inflammatory changes.  Spleen: Normal in size without focal abnormality.  Adrenals/Urinary Tract: The adrenal glands unremarkable. The right kidney is unremarkable. There is a 4 mm  nonobstructing stone in the interpolar aspect of the left kidney and a 5 mm left renal inferior pole nonobstructing stone. There is mild left renal upper pole caliectasis versus parapelvic cyst. There is a string of small adjacent stones in the distal left ureter each measuring approximately 2-3 mm and a combined length of approximately 13 mm (coronal 97/4). The right ureter is unremarkable. The urinary bladder is partially distended and grossly unremarkable.  Stomach/Bowel: There is moderate stool throughout the colon. There is no bowel obstruction or active inflammation. The appendix is normal.  Vascular/Lymphatic: The abdominal aorta and IVC unremarkable. No portal venous gas. There is no adenopathy.  Reproductive: The prostate and seminal vesicles are grossly unremarkable. No pelvic mass.  Other: Small fat containing umbilical hernia  Musculoskeletal: Degenerative changes of the spine. No acute osseous pathology.  IMPRESSION: 1. A string of small adjacent stones in the distal left ureter with mild  left renal upper pole caliectasis versus parapelvic cyst. Additional nonobstructing left renal calculi noted. 2. No bowel obstruction. Normal appendix.   Electronically Signed By: Anner Crete M.D. On: 01/20/2020 17:53  CT scan was personally reviewed.  Agree with radiology interpretation.  When reviewing previous KUB, can in fact see the string of left distal ureteral stones in retrospect.  Assessment & Plan:    1. 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  We discussed various treatment options for urolithiasis including observation with or without medical expulsive therapy, shockwave lithotripsy (SWL), ureteroscopy and laser lithotripsy with stent placement, and percutaneous nephrolithotomy.     We discussed that management is based on stone size, location, density, patient co-morbidities, and patient preference.    Stones <43mm in size have a >80% spontaneous passage rate. Data surrounding the use of tamsulosin for medical expulsive therapy is controversial, but meta analyses suggests it is most efficacious for distal stones between 5-85mm in size. Possible side effects include dizziness/lightheadedness, and retrograde ejaculation.   SWL has a lower stone free rate in a single procedure, but also a lower complication rate compared to ureteroscopy and avoids a stent and associated stent related symptoms. Possible complications include renal hematoma, steinstrasse, and need for additional treatment. We discussed the role of his increased skin to stone distance can lead to decreased efficacy with shockwave lithotripsy.   Ureteroscopy with laser lithotripsy and stent placement has a higher stone free rate than SWL in a single procedure, however increased complication rate including possible infection, ureteral injury, bleeding, and stent related morbidity. Common stent related symptoms include dysuria, urgency/frequency, and flank pain.   Understands in this particular situation, given that there is multiple stones as well as the fact rather distal, he is a slightly lower chance than normal for shockwave lithotripsy to be effective at high risk of needing multiple procedures.  He understands all this.  He would still prefer to try the least invasive option.  After an extensive discussion of the risks and benefits of the above treatment options, the patient would like to proceed with left ESWL.  - Urinalysis, Complete - CULTURE, URINE COMPREHENSIVE - Abdomen 1 view (KUB); Future   Hollice Espy, MD  [facility] [address], [place], [place] [address] [phone]

## 2020-01-22 NOTE — Progress Notes (Signed)
01/22/2020 2:38 PM   Jeff Baker 19-Nov-1945 [id number]  Referring provider: Trinna Post, PA-C [address] [place],  [place] [address]  Chief Complaint  Patient presents with  . Nephrolithiasis    New Patient    HPI: 74 year old male who presents today for further evaluation of left flank pain and left nephrolithiasis.  He is seen and evaluated by his primary care physician for left flank pain in early November.  He underwent CT stone protocol 01/20/2020 which showed several small nonobstructing left-sided stones measuring up to 5 mm as well as left distal ureteral Steinstrasse with multiple tiny stones measuring up to 13 mm in length.  He was also checked for infection on 01/01/2020 which the urine culture was negative.  He was treated presumptively for possible UTI as a precaution.  He denies of having an acute stone episode.  He does have stones that have "set up shop" in his kidney on the left side for many years which she is known about but never passed.  Today, he is having intermittent left flank pain.  He does feel like this is lower over the past several weeks than previously but persistent which comes and goes.  No significant urinary symptoms.  No gross hematuria.   PMH: Past Medical History:  Diagnosis Date  . 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 Allergic rhinitis   . Anxiety   . Arthritis   . Chronic scapular pain   . History of alcohol abuse   . History of hyperkalemia   . Nocturia   . Osteoarthrosis, hand   . Palpitations   . Pure hypercholesterolemia     Surgical History: Past Surgical History:  Procedure Laterality Date  . COLONOSCOPY WITH PROPOFOL N/A 04/28/2015   Procedure: COLONOSCOPY WITH PROPOFOL;  Surgeon: Lucilla Lame, MD;  Location: [facility] ENDOSCOPY;  Service: Endoscopy;  Laterality: N/A;  . NO PAST SURGERIES      Home Medications:  Allergies as of 01/22/2020      Reactions   Bactrim [sulfamethoxazole-trimethoprim]     Reports mild skin rash, stomach cramps and diarrhea.      Medication List       Accurate as of January 22, 2020  2:38 PM. If you have any questions, ask your nurse or doctor.        STOP taking these medications   tamsulosin 0.4 MG Caps capsule Commonly known as: FLOMAX Stopped by: Hollice Espy, MD     TAKE these medications   fluocinonide cream 0.05 % Commonly known as: LIDEX Apply 1 application topically 3 (three) times daily. To insect bite What changed: additional instructions   ibuprofen 600 MG tablet Commonly known as: ADVIL Take 1 tablet (600 mg total) by mouth every 8 (eight) hours as needed.   multivitamin capsule Take 1 capsule by mouth daily.   sertraline 100 MG tablet Commonly known as: ZOLOFT Take 1 tablet (100 mg total) by mouth daily.       Allergies:  Allergies  Allergen Reactions  . Bactrim [Sulfamethoxazole-Trimethoprim]     Reports mild skin rash, stomach cramps and diarrhea.    Family History: Family History  Problem Relation Age of Onset  . Dementia Mother   . Alcohol abuse Father   . Drug abuse Father   . Pulmonary disease Brother   . Prostate cancer Neg Hx   . Bladder Cancer Neg Hx   . Kidney cancer Neg Hx     Social History:  reports that  he has quit smoking. His smoking use included cigars. He has never used smokeless tobacco. He reports that he does not drink alcohol and does not use drugs.   Physical Exam: BP (!) 145/74   Pulse 64   Ht 6' (1.829 m)   Wt 244 lb (110.7 kg)   BMI 33.09 kg/m   Constitutional:  Alert and oriented, No acute distress. HEENT: [place] AT, moist mucus membranes.  Trachea midline, no masses. Cardiovascular: No clubbing, cyanosis, or edema. Respiratory: Normal respiratory effort, no increased work of breathing. Skin: No rashes, bruises or suspicious lesions. Neurologic: Grossly intact, no focal deficits, moving all 4 extremities. Psychiatric: Normal mood and affect.  Laboratory Data: Lab Results   Component Value Date   WBC 6.8 01/01/2020   HGB 13.0 01/01/2020   HCT 38.5 01/01/2020   MCV 89 01/01/2020   PLT 219 01/01/2020    Lab Results  Component Value Date   CREATININE 0.83 01/01/2020    Lab Results  Component Value Date   HGBA1C 5.9 (A) 10/23/2019    Urinalysis Pending  Pertinent Imaging: Results for orders placed during the hospital encounter of 12/30/19  DG Abd 1 View  Narrative CLINICAL DATA:  Left flank pain. Frequent urination. History of left kidney stone.  EXAM: ABDOMEN - 1 VIEW  COMPARISON:  03/09/2015  FINDINGS: Bowel gas pattern is normal without evidence of ileus, obstruction or apparent free air. 3 mm stone in the lower pole left kidney. No evidence of passing stone. Small phleboliths in the pelvis. Ordinary degenerative changes affect lumbar spine and left hip.  IMPRESSION: 3 mm stone in the lower pole left kidney. No evidence of passing stone.   Electronically Signed By: Nelson Chimes M.D. On: 12/31/2019 15:58   Results for orders placed in visit on 12/30/19  CT RENAL STONE STUDY  Narrative CLINICAL DATA:  74 year old male with left flank pain. Concern for kidney stone.  EXAM: CT ABDOMEN AND PELVIS WITHOUT CONTRAST  TECHNIQUE: Multidetector CT imaging of the abdomen and pelvis was performed following the standard protocol without IV contrast.  COMPARISON:  None.  FINDINGS: Evaluation of this exam is limited in the absence of intravenous contrast.  Lower chest: The visualized lung bases are clear.  No intra-abdominal free air or free fluid.  Hepatobiliary: The liver is unremarkable. No intrahepatic biliary ductal dilatation. The gallbladder is unremarkable.  Pancreas: Unremarkable. No pancreatic ductal dilatation or surrounding inflammatory changes.  Spleen: Normal in size without focal abnormality.  Adrenals/Urinary Tract: The adrenal glands unremarkable. The right kidney is unremarkable. There is a 4 mm  nonobstructing stone in the interpolar aspect of the left kidney and a 5 mm left renal inferior pole nonobstructing stone. There is mild left renal upper pole caliectasis versus parapelvic cyst. There is a string of small adjacent stones in the distal left ureter each measuring approximately 2-3 mm and a combined length of approximately 13 mm (coronal 97/4). The right ureter is unremarkable. The urinary bladder is partially distended and grossly unremarkable.  Stomach/Bowel: There is moderate stool throughout the colon. There is no bowel obstruction or active inflammation. The appendix is normal.  Vascular/Lymphatic: The abdominal aorta and IVC unremarkable. No portal venous gas. There is no adenopathy.  Reproductive: The prostate and seminal vesicles are grossly unremarkable. No pelvic mass.  Other: Small fat containing umbilical hernia  Musculoskeletal: Degenerative changes of the spine. No acute osseous pathology.  IMPRESSION: 1. A string of small adjacent stones in the distal left ureter with mild  left renal upper pole caliectasis versus parapelvic cyst. Additional nonobstructing left renal calculi noted. 2. No bowel obstruction. Normal appendix.   Electronically Signed By: Anner Crete M.D. On: 01/20/2020 17:53  CT scan was personally reviewed.  Agree with radiology interpretation.  When reviewing previous KUB, can in fact see the string of left distal ureteral stones in retrospect.  Assessment & Plan:    1. 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  We discussed various treatment options for urolithiasis including observation with or without medical expulsive therapy, shockwave lithotripsy (SWL), ureteroscopy and laser lithotripsy with stent placement, and percutaneous nephrolithotomy.    We discussed that management is based on stone size, location, density, patient co-morbidities, and patient preference.    Stones <81mm in size have a >80% spontaneous passage rate. Data surrounding the use of tamsulosin for medical expulsive therapy is controversial, but meta analyses suggests it is most efficacious for distal stones between 5-28mm in size. Possible side effects include dizziness/lightheadedness, and retrograde ejaculation.   SWL has a lower stone free rate in a single procedure, but also a lower complication rate compared to ureteroscopy and avoids a stent and associated stent related symptoms. Possible complications include renal hematoma, steinstrasse, and need for additional treatment. We discussed the role of his increased skin to stone distance can lead to decreased efficacy with shockwave lithotripsy.   Ureteroscopy with laser lithotripsy and stent placement has a higher stone free rate than SWL in a single procedure, however increased complication rate including possible infection, ureteral injury, bleeding, and stent related morbidity. Common stent related symptoms include dysuria, urgency/frequency, and flank pain.   Understands in this particular situation, given that there is multiple stones as well as the fact rather distal, he is a slightly lower chance than normal for shockwave lithotripsy to be effective at high risk of needing multiple procedures.  He understands all this.  He would still prefer to try the least invasive option.  After an extensive discussion of the risks and benefits of the above treatment options, the patient would like to proceed with left ESWL.  - Urinalysis, Complete - CULTURE, URINE COMPREHENSIVE - Abdomen 1 view (KUB); Future   Hollice Espy, MD  [facility] [address] [place], [place] [address] [phone]

## 2020-01-23 ENCOUNTER — Ambulatory Visit
Admission: RE | Admit: 2020-01-23 | Discharge: 2020-01-23 | Disposition: A | Discharge status: Home / Self Care | Payer: Medicare PPO | Attending: Urology | Admitting: Urology

## 2020-01-23 ENCOUNTER — Encounter: Payer: Self-pay | Admitting: Urology

## 2020-01-23 ENCOUNTER — Encounter
Admission: RE | Disposition: A | Discharge status: Home / Self Care | Payer: Self-pay | Source: Home / Self Care | Attending: Urology

## 2020-01-23 DIAGNOSIS — N202 Calculus of kidney with calculus of ureter: Secondary | ICD-10-CM | POA: Insufficient documentation

## 2020-01-23 DIAGNOSIS — N201 Calculus of ureter: Secondary | ICD-10-CM

## 2020-01-23 HISTORY — PX: EXTRACORPOREAL SHOCK WAVE LITHOTRIPSY: SHX1557

## 2020-01-23 SURGERY — LITHOTRIPSY, ESWL
Anesthesia: Moderate Sedation | Laterality: Left

## 2020-01-23 MED ORDER — DIPHENHYDRAMINE HCL 25 MG PO CAPS
ORAL_CAPSULE | ORAL | Status: AC
  Filled 2020-01-23: qty 1

## 2020-01-23 MED ORDER — CEPHALEXIN 500 MG PO CAPS
ORAL_CAPSULE | ORAL | Status: AC
  Administered 2020-01-23: 500 mg via ORAL
  Filled 2020-01-23: qty 1

## 2020-01-23 MED ORDER — OXYCODONE-ACETAMINOPHEN 5-325 MG PO TABS
1.0000 | ORAL_TABLET | ORAL | 0 refills | Status: DC | PRN
Start: 2020-01-23 — End: 2020-02-03

## 2020-01-23 MED ORDER — TAMSULOSIN HCL 0.4 MG PO CAPS: 0.4000 mg | ORAL_CAPSULE | Freq: Every day | ORAL | 0 refills | Status: DC

## 2020-01-23 MED ORDER — DIAZEPAM 5 MG PO TABS
ORAL_TABLET | ORAL | Status: AC
  Administered 2020-01-23: 10 mg via ORAL
  Filled 2020-01-23: qty 2

## 2020-01-23 NOTE — Interval H&P Note (Signed)
History and Physical Interval Note:  01/23/2020 11:44 AM  Jeff Baker  has presented today for surgery, with the diagnosis of Kidney stone.  The various methods of treatment have been discussed with the patient and family. After consideration of risks, benefits and other options for treatment, the patient has consented to  Procedure(s): EXTRACORPOREAL SHOCK WAVE LITHOTRIPSY (ESWL) (Left) as a surgical intervention.  The patient's history has been reviewed, patient examined, no change in status, stable for surgery.  I have reviewed the patient's chart and labs.  Questions were answered to the patient's satisfaction.    RRR CTAB  Hollice Espy

## 2020-01-23 NOTE — Discharge Instructions (Signed)
See [facility] discharge instructions in chart.  AMBULATORY SURGERY  DISCHARGE INSTRUCTIONS   1) The drugs that you were given will stay in your system until tomorrow so for the next 24 hours you should not:  A) Drive an automobile B) Make any legal decisions C) Drink any alcoholic beverage   2) You may resume regular meals tomorrow.  Today it is better to start with liquids and gradually work up to solid foods.  You may eat anything you prefer, but it is better to start with liquids, then soup and crackers, and gradually work up to solid foods.   3) Please notify your doctor immediately if you have any unusual bleeding, trouble breathing, redness and pain at the surgery site, drainage, fever, or pain not relieved by medication.    4) Additional Instructions:        Please contact your physician with any problems or Same Day Surgery at [phone], Monday through Friday 6 am to 4 pm, or [place] [facility] [place] [facility] number at [phone].  

## 2020-01-24 LAB — URINALYSIS, COMPLETE
Bilirubin, UA: NEGATIVE
Glucose, UA: NEGATIVE
Ketones, UA: NEGATIVE
Leukocytes,UA: NEGATIVE
Nitrite, UA: NEGATIVE
Protein,UA: NEGATIVE
RBC, UA: NEGATIVE
Specific Gravity, UA: 1.025 (ref 1.005–1.030)
Urobilinogen, Ur: 0.2 mg/dL (ref 0.2–1.0)
pH, UA: 5.5 (ref 5.0–7.5)

## 2020-01-24 LAB — MICROSCOPIC EXAMINATION
Bacteria, UA: NONE SEEN
RBC, Urine: NONE SEEN /hpf (ref 0–2)

## 2020-01-25 LAB — CULTURE, URINE COMPREHENSIVE

## 2020-02-07 ENCOUNTER — Ambulatory Visit (INDEPENDENT_AMBULATORY_CARE_PROVIDER_SITE_OTHER): Payer: Medicare PPO | Admitting: Physician Assistant

## 2020-02-07 ENCOUNTER — Encounter: Payer: Self-pay | Admitting: Physician Assistant

## 2020-02-07 ENCOUNTER — Ambulatory Visit
Admission: RE | Admit: 2020-02-07 | Discharge: 2020-02-07 | Disposition: A | Discharge status: Home / Self Care | Payer: Medicare PPO | Source: Ambulatory Visit | Attending: Urology | Admitting: Urology

## 2020-02-07 ENCOUNTER — Other Ambulatory Visit: Payer: Self-pay

## 2020-02-07 VITALS — BP 131/73 | HR 68 | Ht 72.0 in | Wt 242.0 lb

## 2020-02-07 DIAGNOSIS — N201 Calculus of ureter: Secondary | ICD-10-CM | POA: Insufficient documentation

## 2020-02-07 LAB — MICROSCOPIC EXAMINATION
Bacteria, UA: NONE SEEN
RBC, Urine: NONE SEEN /hpf (ref 0–2)

## 2020-02-07 LAB — URINALYSIS, COMPLETE
Bilirubin, UA: NEGATIVE
Glucose, UA: NEGATIVE
Ketones, UA: NEGATIVE
Leukocytes,UA: NEGATIVE
Nitrite, UA: NEGATIVE
Protein,UA: NEGATIVE
RBC, UA: NEGATIVE
Specific Gravity, UA: 1.02 (ref 1.005–1.030)
Urobilinogen, Ur: 0.2 mg/dL (ref 0.2–1.0)
pH, UA: 5 (ref 5.0–7.5)

## 2020-02-07 NOTE — Progress Notes (Signed)
02/07/2020 10:09 AM   Jeff Baker [birth year] [id number]  CC: Chief Complaint  Patient presents with  . Nephrolithiasis   HPI: Jeff Baker is a 74 y.o. male with PMH nephrolithiasis s/p ESWL with Dr. Erlene Quan on 01/23/2020 for management of a 13 mm distal left ureteral Steinstrasse who presents today for postop follow-up.  Today he reports significant improvement in his left sided pain following ESWL.  He has noted some small fragments and debris passing his urine but was unable to collect any for analysis.  He continues to have mild intermittent left flank pain.  He did not take Flomax after the procedure due to concerns for hypotension.  KUB today reveals persistence of the distal left ureteral Steinstrasse.  In-office UA and microscopy today pan negative.  PMH: Past Medical History:  Diagnosis Date  . 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 Allergic rhinitis   . Anxiety   . Arthritis   . Chronic scapular pain   . History of alcohol abuse   . History of hyperkalemia   . Nocturia   . Osteoarthrosis, hand   . Palpitations   . Pure hypercholesterolemia     Surgical History: Past Surgical History:  Procedure Laterality Date  . COLONOSCOPY WITH PROPOFOL N/A 04/28/2015   Procedure: COLONOSCOPY WITH PROPOFOL;  Surgeon: Lucilla Lame, MD;  Location: [facility] ENDOSCOPY;  Service: Endoscopy;  Laterality: N/A;  . EXTRACORPOREAL SHOCK WAVE LITHOTRIPSY Left 01/23/2020   Procedure: EXTRACORPOREAL SHOCK WAVE LITHOTRIPSY (ESWL);  Surgeon: Hollice Espy, MD;  Location: [facility] ORS;  Service: Urology;  Laterality: Left;  . NO PAST SURGERIES      Home Medications:  Allergies as of 02/07/2020      Reactions   Bactrim [sulfamethoxazole-trimethoprim]    Reports mild skin rash, stomach cramps and diarrhea.      Medication List       Accurate as of February 07, 2020 10:09 AM. If you have any questions, ask your nurse or doctor.        ibuprofen 600 MG tablet Commonly known as:  ADVIL Take 1 tablet (600 mg total) by mouth every 8 (eight) hours as needed.   multivitamin capsule Take 1 capsule by mouth daily.   sertraline 100 MG tablet Commonly known as: ZOLOFT Take 1 tablet (100 mg total) by mouth daily.       Allergies:  Allergies  Allergen Reactions  . Bactrim [Sulfamethoxazole-Trimethoprim]     Reports mild skin rash, stomach cramps and diarrhea.    Family History: Family History  Problem Relation Age of Onset  . Dementia Mother   . Alcohol abuse Father   . Drug abuse Father   . Pulmonary disease Brother   . Prostate cancer Neg Hx   . Bladder Cancer Neg Hx   . Kidney cancer Neg Hx     Social History:   reports that he has quit smoking. His smoking use included cigars. He has never used smokeless tobacco. He reports that he does not drink alcohol and does not use drugs.  Physical Exam: BP 131/73 (BP Location: Left Arm, Patient Position: Sitting, Cuff Size: Normal)   Pulse 68   Ht 6' (1.829 m)   Wt 242 lb (109.8 kg)   BMI 32.82 kg/m   Constitutional:  Alert and oriented, no acute distress, nontoxic appearing HEENT: [place], AT Cardiovascular: No clubbing, cyanosis, or edema Respiratory: Normal respiratory effort, no increased work of breathing Skin: No rashes, bruises or suspicious lesions Neurologic: Grossly intact, no focal  deficits, moving all 4 extremities Psychiatric: Normal mood and affect  Laboratory Data: Results for orders placed or performed in visit on 02/07/20  Microscopic Examination   Urine  Result Value Ref Range   WBC, UA 0-5 0 - 5 /hpf   RBC None seen 0 - 2 /hpf   Epithelial Cells (non renal) 0-10 0 - 10 /hpf   Bacteria, UA None seen None seen/Few  Urinalysis, Complete  Result Value Ref Range   Specific Gravity, UA 1.020 1.005 - 1.030   pH, UA 5.0 5.0 - 7.5   Color, UA Yellow Yellow   Appearance Ur Clear Clear   Leukocytes,UA Negative Negative   Protein,UA Negative Negative/Trace   Glucose, UA Negative Negative    Ketones, UA Negative Negative   RBC, UA Negative Negative   Bilirubin, UA Negative Negative   Urobilinogen, Ur 0.2 0.2 - 1.0 mg/dL   Nitrite, UA Negative Negative   Microscopic Examination See below:    Pertinent Imaging: KUB, 02/07/2020: CLINICAL DATA:  Left ureteral stone  EXAM: ABDOMEN - 1 VIEW  COMPARISON:  01/22/2020  FINDINGS: Two adjacent calcifications are again seen within the region of the left ureterovesical junction, unchanged in size and appearance compared to the previous study. Additional calcification projects over the lower pole of the left kidney, also unchanged. A nonobstructive bowel gas pattern mild to moderate volume of stool projects throughout the colon.  IMPRESSION: Unchanged size and appearance of two adjacent calcifications within the region of the left ureterovesical junction and lower pole left renal calculus.   Electronically Signed   By: Davina Poke D.O.   On: 02/07/2020 14:35  I personally reviewed the images referenced above and note persistence of the distal left ureteral Steinstrasse.  Assessment & Plan:   1. Left ureteral stone Distal left ureteral stone burden not cleared on KUB, however UA is benign today.  At this point, I recommend initiation of Flomax and pushing fluids with plans for repeat KUB and UA in 4 weeks.  If stones have not cleared at that point, recommend discussion of retreatment with repeat ESWL versus ureteroscopy.  Patient expressed understanding.  Counseled patient to contact her office immediately or proceed to the emergency room if he develops fever, chills, nausea, vomiting, or uncontrollable pain. - Urinalysis, Complete - DG Abd 1 View; Future   Return in about 4 weeks (around 03/06/2020) for Stone f/u with UA + KUB prior.  Debroah Loop, PA-C  [facility] [address] [place], [place] [address] [phone]

## 2020-02-07 NOTE — Patient Instructions (Signed)
Start Flomax. If you develop dizziness or lightheadedness, especially with standing, on this medication, you may stop it. Increase your fluid intake. I'll see you back in clinic in 4 weeks to reevaluate your stones. Please call our office immediately or go to the Emergency Department if you develop fever, chills, nausea, vomiting, or acute worsening of your pain.

## 2020-02-10 ENCOUNTER — Other Ambulatory Visit: Payer: Self-pay | Admitting: Physician Assistant

## 2020-02-10 DIAGNOSIS — F411 Generalized anxiety disorder: Secondary | ICD-10-CM

## 2020-03-06 ENCOUNTER — Ambulatory Visit: Payer: Self-pay | Admitting: Physician Assistant

## 2020-03-20 ENCOUNTER — Ambulatory Visit: Payer: Self-pay | Admitting: Physician Assistant

## 2020-06-17 ENCOUNTER — Ambulatory Visit (INDEPENDENT_AMBULATORY_CARE_PROVIDER_SITE_OTHER): Payer: Medicare PPO | Admitting: Family Medicine

## 2020-06-17 ENCOUNTER — Encounter: Payer: Self-pay | Admitting: Family Medicine

## 2020-06-17 ENCOUNTER — Other Ambulatory Visit: Payer: Self-pay

## 2020-06-17 VITALS — BP 135/80 | HR 64 | Temp 97.8°F | Ht 73.0 in | Wt 240.0 lb

## 2020-06-17 DIAGNOSIS — Z125 Encounter for screening for malignant neoplasm of prostate: Secondary | ICD-10-CM

## 2020-06-17 DIAGNOSIS — E7800 Pure hypercholesterolemia, unspecified: Secondary | ICD-10-CM | POA: Diagnosis not present

## 2020-06-17 DIAGNOSIS — Z0000 Encounter for general adult medical examination without abnormal findings: Secondary | ICD-10-CM | POA: Diagnosis not present

## 2020-06-17 DIAGNOSIS — R7303 Prediabetes: Secondary | ICD-10-CM

## 2020-06-17 DIAGNOSIS — Z1211 Encounter for screening for malignant neoplasm of colon: Secondary | ICD-10-CM

## 2020-06-17 NOTE — Patient Instructions (Signed)
Health Maintenance After Age 75 After age 75, you are at a higher risk for certain long-term diseases and infections as well as injuries from falls. Falls are a major cause of broken bones and head injuries in people who are older than age 75. Getting regular preventive care can help to keep you healthy and well. Preventive care includes getting regular testing and making lifestyle changes as recommended by your health care provider. Talk with your health care provider about:  Which screenings and tests you should have. A screening is a test that checks for a disease when you have no symptoms.  A diet and exercise plan that is right for you. What should I know about screenings and tests to prevent falls? Screening and testing are the best ways to find a health problem early. Early diagnosis and treatment give you the best chance of managing medical conditions that are common after age 75. Certain conditions and lifestyle choices may make you more likely to have a fall. Your health care provider may recommend:  Regular vision checks. Poor vision and conditions such as cataracts can make you more likely to have a fall. If you wear glasses, make sure to get your prescription updated if your vision changes.  Medicine review. Work with your health care provider to regularly review all of the medicines you are taking, including over-the-counter medicines. Ask your health care provider about any side effects that may make you more likely to have a fall. Tell your health care provider if any medicines that you take make you feel dizzy or sleepy.  Osteoporosis screening. Osteoporosis is a condition that causes the bones to get weaker. This can make the bones weak and cause them to break more easily.  Blood pressure screening. Blood pressure changes and medicines to control blood pressure can make you feel dizzy.  Strength and balance checks. Your health care provider may recommend certain tests to check your  strength and balance while standing, walking, or changing positions.  Foot health exam. Foot pain and numbness, as well as not wearing proper footwear, can make you more likely to have a fall.  Depression screening. You may be more likely to have a fall if you have a fear of falling, feel emotionally low, or feel unable to do activities that you used to do.  Alcohol use screening. Using too much alcohol can affect your balance and may make you more likely to have a fall. What actions can I take to lower my risk of falls? General instructions  Talk with your health care provider about your risks for falling. Tell your health care provider if: ? You fall. Be sure to tell your health care provider about all falls, even ones that seem minor. ? You feel dizzy, sleepy, or off-balance.  Take over-the-counter and prescription medicines only as told by your health care provider. These include any supplements.  Eat a healthy diet and maintain a healthy weight. A healthy diet includes low-fat dairy products, low-fat (lean) meats, and fiber from whole grains, beans, and lots of fruits and vegetables. Home safety  Remove any tripping hazards, such as rugs, cords, and clutter.  Install safety equipment such as grab bars in bathrooms and safety rails on stairs.  Keep rooms and walkways well-lit. Activity  Follow a regular exercise program to stay fit. This will help you maintain your balance. Ask your health care provider what types of exercise are appropriate for you.  If you need a cane or walker,   use it as recommended by your health care provider.  Wear supportive shoes that have nonskid soles.   Lifestyle  Do not drink alcohol if your health care provider tells you not to drink.  If you drink alcohol, limit how much you have: ? 0-1 drink a day for women. ? 0-2 drinks a day for men.  Be aware of how much alcohol is in your drink. In the [place] one drink equals one typical bottle of beer (12  oz), one-half glass of wine (5 oz), or one shot of hard liquor (1 oz).  Do not use any products that contain nicotine or tobacco, such as cigarettes and e-cigarettes. If you need help quitting, ask your health care provider. Summary  Having a healthy lifestyle and getting preventive care can help to protect your health and wellness after age 75.  Screening and testing are the best way to find a health problem early and help you avoid having a fall. Early diagnosis and treatment give you the best chance for managing medical conditions that are more common for people who are older than age 75.  Falls are a major cause of broken bones and head injuries in people who are older than age 75. Take precautions to prevent a fall at home.  Work with your health care provider to learn what changes you can make to improve your health and wellness and to prevent falls. This information is not intended to replace advice given to you by your health care provider. Make sure you discuss any questions you have with your health care provider. Document Revised: 05/31/2018 Document Reviewed: 12/21/2016 Elsevier Patient Education  2021 [employer]  

## 2020-06-17 NOTE — Progress Notes (Signed)
Annual Wellness Visit     Patient: Jeff Baker, Male    DOB: November 10, 1945, 75 y.o.   MRN: [id number] Visit Date: 06/17/2020  Today's Provider: Laurita Quint Rhodie Cienfuegos, FNP   Chief Complaint  Patient presents with  . Annual Exam   Subjective    Jeff Baker is a 75 y.o. male who presents today for his Annual Wellness Visit. He reports consuming a general diet. Exercises regularly. He generally feels well. He reports sleeping well. He does not have additional problems to discuss today.   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 Prediabetes 10/23/2019  . Hearing loss 03/17/2016  . Benign neoplasm of ascending colon   . Benign neoplasm of transverse colon   . Achilles bursitis 03/13/2015  . History of kidney stones 03/09/2015  . Arthritis 03/06/2015  . Willing to be kidney donor 09/22/2011  . Adiposity 09/22/2011  . Pure hypercholesterolemia 03/12/2009  . Agoraphobia 12/11/2006  . Anxiety, generalized 12/11/2006  . 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 Allergic rhinitis due to dust 12/11/2006   Past Medical History:  Diagnosis Date  . 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 Allergic rhinitis   . Anxiety   . Arthritis   . Chronic scapular pain   . History of alcohol abuse   . History of hyperkalemia   . Nocturia   . Osteoarthrosis, hand   . Palpitations   . Pure  hypercholesterolemia    Social History   Tobacco Use  . Smoking status: Former Smoker    Types: Cigars  . Smokeless tobacco: Never Used  Vaping Use  . Vaping Use: Never used  Substance Use Topics  . 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 Drug use: No   Allergies  Allergen Reactions  . Bactrim [Sulfamethoxazole-Trimethoprim]     Reports mild skin rash, stomach cramps and diarrhea.     Medications: Outpatient Medications Prior to Visit  Medication Sig  . ibuprofen (ADVIL,MOTRIN) 600 MG tablet Take 1 tablet (600 mg total) by mouth every 8 (eight) hours as needed.  . Multiple Vitamin (MULTIVITAMIN) capsule Take 1 capsule by mouth daily.   . sertraline (ZOLOFT) 100 MG tablet TAKE 1 TABLET BY MOUTH EVERY DAY   No facility-administered medications prior to visit.    Allergies  Allergen Reactions  . Bactrim [Sulfamethoxazole-Trimethoprim]     Reports mild skin rash, stomach cramps and diarrhea.    Patient Care Team: Paulene Floor as PCP - General (Physician Assistant) Pa, [facility])  Review of Systems  Constitutional: Negative.   HENT: Positive for congestion, postnasal drip, rhinorrhea, sinus pressure, sinus pain, sneezing and tinnitus. Negative for dental problem, drooling, ear discharge, ear pain, facial swelling, hearing loss, mouth sores, nosebleeds, sore  throat, trouble swallowing and voice change.   Eyes: Negative.   Respiratory: Negative.   Cardiovascular: Negative.   Gastrointestinal: Negative.   Endocrine: Negative.   Genitourinary: Negative.   Musculoskeletal: Positive for neck pain and neck stiffness.  Skin: Negative.   Allergic/Immunologic: Negative.   Neurological: Negative.   Hematological: Negative.   Psychiatric/Behavioral: Negative.         Objective    Vitals: BP 135/80 (BP Location: Right Arm, Patient Position: Sitting, Cuff Size:  Large)   Pulse 64   Temp 97.8 F (36.6 C) (Oral)   Ht 6\' 1"  (1.854 m)   Wt 240 lb (108.9 kg)   BMI 31.66 kg/m    Physical Exam Constitutional:      General: He is not in acute distress.    Appearance: Normal appearance. He is normal weight. He is not ill-appearing.  HENT:     Head: Normocephalic.     Right Ear: Tympanic membrane, ear canal and external ear normal. There is no impacted cerumen.     Left Ear: Tympanic membrane, ear canal and external ear normal. There is no impacted cerumen.     Nose: Nose normal.     Mouth/Throat:     Mouth: Mucous membranes are moist.     Dentition: Normal dentition.     Pharynx: Oropharynx is clear. No pharyngeal swelling, oropharyngeal exudate or posterior oropharyngeal erythema.  Eyes:     Extraocular Movements: Extraocular movements intact.     Conjunctiva/sclera: Conjunctivae normal.     Pupils: Pupils are equal, round, and reactive to light.  Neck:     Thyroid: No thyroid mass, thyromegaly or thyroid tenderness.  Cardiovascular:     Rate and Rhythm: Normal rate and regular rhythm.     Pulses: Normal pulses.     Heart sounds: Normal heart sounds. No murmur heard. No friction rub. No gallop.   Pulmonary:     Effort: Pulmonary effort is normal. No respiratory distress.     Breath sounds: Normal breath sounds.  Abdominal:     General: Bowel sounds are normal.     Palpations: Abdomen is soft.     Tenderness: There is no abdominal tenderness.     Hernia: No hernia is present.  Musculoskeletal:        General: No tenderness, deformity or signs of injury. Normal range of motion.     Cervical back: Normal range of motion.  Lymphadenopathy:     Cervical: No cervical adenopathy.  Skin:    General: Skin is warm and dry.     Findings: Ecchymosis (generalized fragile) present.  Neurological:     Mental Status: He is alert and oriented to person, place, and time.  Psychiatric:        Mood and Affect: Mood normal.        Behavior: Behavior  normal.     Most recent functional status assessment: In your present state of health, do you have any difficulty performing the following activities: 06/17/2020  Hearing? N  Vision? Y  Difficulty concentrating or making decisions? N  Walking or climbing stairs? N  Dressing or bathing? N  Doing errands, shopping? N  Some recent data might be hidden   Most recent fall risk assessment: Fall Risk  06/17/2020  Falls in the past year? 0  Number falls in past yr: 0  Comment -  Injury with Fall? 0  Comment -  Risk for fall due to : No Fall Risks  Follow up Falls evaluation  completed    Most recent depression screenings: PHQ 2/9 Scores 06/17/2020 12/30/2019  PHQ - 2 Score 0 0  PHQ- 9 Score 1 1  Exception Documentation - -   Most recent cognitive screening: 6CIT Screen 06/11/2019  What Year? 0 points  What month? 0 points  What time? 0 points  Count back from 20 0 points  Months in reverse 0 points  Repeat phrase 2 points  Total Score 2   Most recent Audit-C alcohol use screening Alcohol Use Disorder Test (AUDIT) 06/17/2020  1. How often do you have a drink containing alcohol? 0  2. How many drinks containing alcohol do you have on a typical day when you are drinking? 0  3. How often do you have six or more drinks on one occasion? 0  AUDIT-C Score 0  Alcohol Brief Interventions/Follow-up -   A score of 3 or more in women, and 4 or more in men indicates increased risk for alcohol abuse, EXCEPT if all of the points are from question 1    No results found for any visits on 06/17/20.  Assessment & Plan     Annual wellness visit done today including the all of the following: Reviewed patient's Family Medical History Reviewed and updated list of patient's medical providers Assessment of cognitive impairment was done Assessed patient's functional ability Established a written schedule for health screening [place] Completed and Reviewed  Exercise  Activities and Dietary recommendations Goals    . DIET - INCREASE WATER INTAKE     Recommend to drink at least 6-8 8oz glasses of water per day.       Immunization History  Administered Date(s) Administered  . Fluad Quad(high Dose 65+) 12/05/2018, 12/30/2019  . Influenza, High Dose Seasonal PF 12/24/2016, 12/08/2017  . Influenza,inj,Quad PF,6+ Mos 03/09/2015  . Influenza-Unspecified 12/07/2013  . PFIZER(Purple Top)SARS-COV-2 Vaccination 04/06/2019, 04/27/2019  . Pneumococcal Conjugate-13 02/26/2014  . Pneumococcal Polysaccharide-23 04/01/2011  . Tdap 12/01/2015  . Zoster Recombinat (Shingrix) 03/20/2017, 04/16/2018    Health Maintenance  Topic Date Due  . COVID-19 Vaccine (3 - Pfizer risk 4-dose series) 05/25/2019  . COLONOSCOPY (Pts 45-39yrs Insurance coverage will need to be confirmed)  04/27/2020  . INFLUENZA VACCINE  09/21/2020  . TETANUS/TDAP  11/30/2025  . Hepatitis C Screening  Completed  . PNA vac Low Risk Adult  Completed  . HPV VACCINES  Aged Out     Discussed health benefits of physical activity, and encouraged him to engage in regular exercise appropriate for his age and condition.    Problem List Items Addressed This Visit      Other   Pure hypercholesterolemia   Prediabetes - Primary   Relevant Orders   Hemoglobin A1c   Comprehensive metabolic panel    Other Visit Diagnoses    Annual physical exam       Relevant Orders   CBC with Differential   TSH   Vitamin D, 25-hydroxy   Prostate cancer screening       Relevant Orders   PSA   Colon cancer screening       Relevant Orders   Ambulatory referral to Gastroenterology     Plan  Declined lipid screening  Will follow up on lab results  Referral placed to GI for colonoscopy  Encouraged to get covid booster  Return in about 6 months (around 12/17/2020).      [place], [place] [phone] (phone) [phone] (fax)  [facility]

## 2020-06-18 LAB — COMPREHENSIVE METABOLIC PANEL
ALT: 16 IU/L (ref 0–44)
AST: 16 IU/L (ref 0–40)
Albumin/Globulin Ratio: 1.8 (ref 1.2–2.2)
Albumin: 4.5 g/dL (ref 3.7–4.7)
Alkaline Phosphatase: 74 IU/L (ref 44–121)
BUN/Creatinine Ratio: 21 (ref 10–24)
BUN: 19 mg/dL (ref 8–27)
Bilirubin Total: 0.9 mg/dL (ref 0.0–1.2)
CO2: 23 mmol/L (ref 20–29)
Calcium: 9.5 mg/dL (ref 8.6–10.2)
Chloride: 102 mmol/L (ref 96–106)
Creatinine, Ser: 0.91 mg/dL (ref 0.76–1.27)
Globulin, Total: 2.5 g/dL (ref 1.5–4.5)
Glucose: 101 mg/dL — ABNORMAL HIGH (ref 65–99)
Potassium: 4.8 mmol/L (ref 3.5–5.2)
Sodium: 141 mmol/L (ref 134–144)
Total Protein: 7 g/dL (ref 6.0–8.5)
eGFR: 88 mL/min/{1.73_m2} (ref >59)

## 2020-06-18 LAB — CBC WITH DIFFERENTIAL/PLATELET
Basophils Absolute: 0.1 10*3/uL (ref 0.0–0.2)
Basos: 2 %
EOS (ABSOLUTE): 0.2 10*3/uL (ref 0.0–0.4)
Eos: 3 %
Hematocrit: 42.5 % (ref 37.5–51.0)
Hemoglobin: 14.4 g/dL (ref 13.0–17.7)
Immature Grans (Abs): 0 10*3/uL (ref 0.0–0.1)
Immature Granulocytes: 0 %
Lymphocytes Absolute: 2 10*3/uL (ref 0.7–3.1)
Lymphs: 32 %
MCH: 30.4 pg (ref 26.6–33.0)
MCHC: 33.9 g/dL (ref 31.5–35.7)
MCV: 90 fL (ref 79–97)
Monocytes Absolute: 0.5 10*3/uL (ref 0.1–0.9)
Monocytes: 9 %
Neutrophils Absolute: 3.3 10*3/uL (ref 1.4–7.0)
Neutrophils: 54 %
Platelets: 220 10*3/uL (ref 150–450)
RBC: 4.73 x10E6/uL (ref 4.14–5.80)
RDW: 12.6 % (ref 11.6–15.4)
WBC: 6 10*3/uL (ref 3.4–10.8)

## 2020-06-18 LAB — HEMOGLOBIN A1C
Est. average glucose Bld gHb Est-mCnc: 126 mg/dL
Hgb A1c MFr Bld: 6 % — ABNORMAL HIGH (ref 4.8–5.6)

## 2020-06-18 LAB — PSA: Prostate Specific Ag, Serum: 0.8 ng/mL (ref 0.0–4.0)

## 2020-06-18 LAB — TSH: TSH: 2.58 u[IU]/mL (ref 0.450–4.500)

## 2020-06-18 LAB — VITAMIN D 25 HYDROXY (VIT D DEFICIENCY, FRACTURES): Vit D, 25-Hydroxy: 17.3 ng/mL — ABNORMAL LOW (ref 30.0–100.0)

## 2020-06-25 ENCOUNTER — Telehealth: Payer: Medicare PPO

## 2020-06-30 ENCOUNTER — Telehealth (INDEPENDENT_AMBULATORY_CARE_PROVIDER_SITE_OTHER): Payer: Self-pay | Admitting: Gastroenterology

## 2020-06-30 DIAGNOSIS — Z86010 Personal history of colonic polyps: Secondary | ICD-10-CM

## 2020-06-30 MED ORDER — NA SULFATE-K SULFATE-MG SULF 17.5-3.13-1.6 GM/177ML PO SOLN: 1.0000 | Freq: Once | ORAL | 0 refills | Status: AC

## 2020-06-30 NOTE — Progress Notes (Signed)
Gastroenterology Pre-Procedure Review  Request Date: August 18, 2020 Requesting Physician: Dr. Allen Norris   PATIENT REVIEW QUESTIONS: The patient responded to the following health history questions as indicated:    1. Are you having any GI issues? no 2. Do you have a personal history of Polyps? yes (2017 ) 3. Do you have a family history of Colon Cancer or Polyps? no 4. Diabetes Mellitus? no 5. Joint replacements in the past 12 months?no 6. Major health problems in the past 3 months?no 7. Any artificial heart valves, MVP, or defibrillator?No    MEDICATIONS & ALLERGIES:    Patient reports the following regarding taking any anticoagulation/antiplatelet therapy:   Plavix, Coumadin, Eliquis, Xarelto, Lovenox, Pradaxa, Brilinta, or Effient? no Aspirin? no  Patient confirms/reports the following medications:  Current Outpatient Medications  Medication Sig Dispense Refill  . ibuprofen (ADVIL,MOTRIN) 600 MG tablet Take 1 tablet (600 mg total) by mouth every 8 (eight) hours as needed. 15 tablet 0  . Multiple Vitamin (MULTIVITAMIN) capsule Take 1 capsule by mouth daily.     . Na Sulfate-K Sulfate-Mg Sulf 17.5-3.13-1.6 GM/177ML SOLN Take 1 kit by mouth once for 1 dose. 354 mL 0  . sertraline (ZOLOFT) 100 MG tablet TAKE 1 TABLET BY MOUTH EVERY DAY 90 tablet 1   No current facility-administered medications for this visit.    Patient confirms/reports the following allergies:  Allergies  Allergen Reactions  . Bactrim [Sulfamethoxazole-Trimethoprim]     Reports mild skin rash, stomach cramps and diarrhea.    No orders of the defined types were placed in this encounter.   AUTHORIZATION INFORMATION Primary Insurance: 1D#: Group #:  Secondary Insurance: 1D#: Group #:  SCHEDULE INFORMATION: Date: 08/18/2020 Time: Location: [place]

## 2020-08-04 ENCOUNTER — Telehealth: Payer: Self-pay

## 2020-08-04 DIAGNOSIS — F411 Generalized anxiety disorder: Secondary | ICD-10-CM

## 2020-08-04 MED ORDER — SERTRALINE HCL 100 MG PO TABS: 1.0000 | ORAL_TABLET | Freq: Every day | ORAL | 1 refills | Status: DC

## 2020-08-04 NOTE — Telephone Encounter (Signed)
[facility] faxed refill request for the following medications:  sertraline (ZOLOFT) 100 MG tablet    Please advise.

## 2020-08-17 ENCOUNTER — Telehealth: Payer: Self-pay

## 2020-08-17 NOTE — Telephone Encounter (Signed)
Patient called to find out what time his procedure was in the morning. Asked patient if he had the paper work that gave him the instructions for the colonoscopy prep. He said he has no paper work but knows what to do. Gave him the phone number to the ENDO unit to call at 1pm

## 2020-08-18 ENCOUNTER — Ambulatory Visit: Payer: Medicare PPO | Admitting: Certified Registered"

## 2020-08-18 ENCOUNTER — Ambulatory Visit
Admission: RE | Admit: 2020-08-18 | Discharge: 2020-08-18 | Disposition: A | Discharge status: Home / Self Care | Payer: Medicare PPO | Attending: Gastroenterology | Admitting: Gastroenterology

## 2020-08-18 ENCOUNTER — Encounter: Payer: Self-pay | Admitting: Gastroenterology

## 2020-08-18 ENCOUNTER — Encounter
Admission: RE | Disposition: A | Discharge status: Home / Self Care | Payer: Self-pay | Source: Home / Self Care | Attending: Gastroenterology

## 2020-08-18 ENCOUNTER — Other Ambulatory Visit: Payer: Self-pay

## 2020-08-18 DIAGNOSIS — Z87891 Personal history of nicotine dependence: Secondary | ICD-10-CM | POA: Diagnosis not present

## 2020-08-18 DIAGNOSIS — Z86010 Personal history of colonic polyps: Secondary | ICD-10-CM | POA: Diagnosis not present

## 2020-08-18 DIAGNOSIS — Z1211 Encounter for screening for malignant neoplasm of colon: Secondary | ICD-10-CM | POA: Diagnosis not present

## 2020-08-18 DIAGNOSIS — Z791 Long term (current) use of non-steroidal anti-inflammatories (NSAID): Secondary | ICD-10-CM | POA: Diagnosis not present

## 2020-08-18 DIAGNOSIS — D123 Benign neoplasm of transverse colon: Secondary | ICD-10-CM | POA: Insufficient documentation

## 2020-08-18 DIAGNOSIS — D124 Benign neoplasm of descending colon: Secondary | ICD-10-CM | POA: Diagnosis not present

## 2020-08-18 DIAGNOSIS — Z881 Allergy status to other antibiotic agents status: Secondary | ICD-10-CM | POA: Diagnosis not present

## 2020-08-18 DIAGNOSIS — Z860101 Personal history of adenomatous and serrated colon polyps: Secondary | ICD-10-CM

## 2020-08-18 DIAGNOSIS — K641 Second degree hemorrhoids: Secondary | ICD-10-CM | POA: Diagnosis not present

## 2020-08-18 DIAGNOSIS — K635 Polyp of colon: Secondary | ICD-10-CM | POA: Diagnosis not present

## 2020-08-18 DIAGNOSIS — Z79899 Other long term (current) drug therapy: Secondary | ICD-10-CM | POA: Insufficient documentation

## 2020-08-18 HISTORY — PX: COLONOSCOPY WITH PROPOFOL: SHX5780

## 2020-08-18 SURGERY — COLONOSCOPY WITH PROPOFOL
Anesthesia: General

## 2020-08-18 MED ORDER — PROPOFOL 10 MG/ML IV BOLUS
INTRAVENOUS | Status: DC | PRN
  Administered 2020-08-18: 80 mg via INTRAVENOUS

## 2020-08-18 MED ORDER — SODIUM CHLORIDE 0.9 % IV SOLN: INTRAVENOUS | Status: DC

## 2020-08-18 MED ORDER — PROPOFOL 500 MG/50ML IV EMUL
INTRAVENOUS | Status: DC | PRN
  Administered 2020-08-18: 150 ug/kg/min via INTRAVENOUS

## 2020-08-18 NOTE — Anesthesia Preprocedure Evaluation (Signed)
Anesthesia Evaluation  Patient identified by MRN, date of birth, ID band Patient awake    Reviewed: Allergy & Precautions, H&P , NPO status , Patient's Chart, lab work & pertinent test results, reviewed documented beta blocker date and time   History of Anesthesia Complications Negative for: history of anesthetic complications  Airway Mallampati: II  TM Distance: >3 FB Neck ROM: full    Dental  (+) Partial Upper, Missing, Chipped, Poor Dentition, Dental Advidsory Given   Pulmonary neg shortness of breath, neg sleep apnea, neg COPD, neg recent URI, Current Smoker (Cigars), former smoker,    Pulmonary exam normal breath sounds clear to auscultation       Cardiovascular Exercise Tolerance: Good negative cardio ROS Normal cardiovascular exam Rhythm:regular Rate:Normal     Neuro/Psych PSYCHIATRIC DISORDERS (Anxiety) negative neurological ROS     GI/Hepatic negative GI ROS, Neg liver ROS,   Endo/Other  negative endocrine ROS  Renal/GU negative Renal ROS  negative genitourinary   Musculoskeletal   Abdominal   Peds  Hematology negative hematology ROS (+)   Anesthesia Other Findings Past Medical History:   Arthritis                                                    Anxiety                                                      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                           History of alcohol abuse                                     Allergic rhinitis                                            Pure hypercholesterolemia                                    Osteoarthrosis, hand                                         Chronic scapular pain                                        History of hyperkalemia                                      Nocturia  Palpitations                                                 Reproductive/Obstetrics negative OB ROS                              Anesthesia Physical  Anesthesia Plan  ASA: 2  Anesthesia Plan: General   Post-op Pain Management:    Induction: Intravenous  PONV Risk Score and Plan: 2 and TIVA and Propofol infusion  Airway Management Planned: Natural Airway and Nasal Cannula  Additional Equipment:   Intra-op Plan:   Post-operative Plan:   Informed Consent: I have reviewed the patients History and Physical, chart, labs and discussed the procedure including the risks, benefits and alternatives for the proposed anesthesia with the patient or authorized representative who has indicated his/her understanding and acceptance.     Dental Advisory Given  Plan Discussed with: Anesthesiologist, CRNA and Surgeon  Anesthesia Plan Comments:         Anesthesia Quick Evaluation

## 2020-08-18 NOTE — Transfer of Care (Signed)
Immediate Anesthesia Transfer of Care Note  Patient: Jeff Baker  Procedure(s) Performed: COLONOSCOPY WITH PROPOFOL  Patient Location: PACU and Endoscopy Unit  Anesthesia Type:General  Level of Consciousness: drowsy  Airway & Oxygen Therapy: Patient Spontanous Breathing  Post-op Assessment: Report given to RN  Post vital signs: stable  Last Vitals:  Vitals Value Taken Time  BP    Temp    Pulse 59 08/18/20 0827  Resp 11 08/18/20 0827  SpO2 95 % 08/18/20 0827  Vitals shown include unvalidated device data.  Last Pain:  Vitals:   08/18/20 0746  TempSrc: Temporal  PainSc: 0-No pain         Complications: No notable events documented.

## 2020-08-18 NOTE — Anesthesia Postprocedure Evaluation (Signed)
Anesthesia Post Note  Patient: Jeff Baker  Procedure(s) Performed: COLONOSCOPY WITH PROPOFOL  Patient location during evaluation: Endoscopy Anesthesia Type: General Level of consciousness: awake and alert Pain management: pain level controlled Vital Signs Assessment: post-procedure vital signs reviewed and stable Respiratory status: spontaneous breathing, nonlabored ventilation, respiratory function stable and patient connected to nasal cannula oxygen Cardiovascular status: blood pressure returned to baseline and stable Postop Assessment: no apparent nausea or vomiting Anesthetic complications: no   No notable events documented.   Last Vitals:  Vitals:   08/18/20 0847 08/18/20 0857  BP: 113/70 133/72  Pulse: (!) 59 (!) 48  Resp: 16 (!) 8  Temp:    SpO2: 96% 97%    Last Pain:  Vitals:   08/18/20 0857  TempSrc:   PainSc: 0-No pain                 Martha Clan

## 2020-08-18 NOTE — H&P (Signed)
Lucilla Lame, MD [address] [place], [place] [address] [phone] Fax : [phone]  Primary Care Physician:  Paulene Floor Primary Gastroenterologist:  Dr. Allen Norris  Pre-Procedure History & Physical: HPI:  Jeff Baker is a 75 y.o. male is here for an colonoscopy.   Past Medical History:  Diagnosis Date   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    Allergic rhinitis    Anxiety    Arthritis    Chronic scapular pain    History of alcohol abuse    History of hyperkalemia    Nocturia    Osteoarthrosis, hand    Palpitations    Pure hypercholesterolemia     Past Surgical History:  Procedure Laterality Date   COLONOSCOPY WITH PROPOFOL N/A 04/28/2015   Procedure: COLONOSCOPY WITH PROPOFOL;  Surgeon: Lucilla Lame, MD;  Location: [facility] ENDOSCOPY;  Service: Endoscopy;  Laterality: N/A;   EXTRACORPOREAL SHOCK WAVE LITHOTRIPSY Left 01/23/2020   Procedure: EXTRACORPOREAL SHOCK WAVE LITHOTRIPSY (ESWL);  Surgeon: Hollice Espy, MD;  Location: [facility] ORS;  Service: Urology;  Laterality: Left;   NO PAST SURGERIES      Prior to Admission medications   Medication Sig Start Date End Date Taking? Authorizing Provider  ibuprofen (ADVIL,MOTRIN) 600 MG tablet Take 1 tablet (600 mg total) by mouth every 8 (eight) hours as needed. 10/19/16  Yes Sable Feil, PA-C  Multiple Vitamin (MULTIVITAMIN) capsule Take 1 capsule by mouth daily.    Yes [provider]  sertraline (ZOLOFT) 100 MG tablet Take 1 tablet (100 mg total) by mouth daily. 08/04/20  Yes Birdie Sons, MD    Allergies as of 06/30/2020 - Review Complete 06/30/2020  Allergen Reaction Noted   Bactrim [sulfamethoxazole-trimethoprim]  10/25/2016    Family History  Problem Relation Age of Onset   Dementia Mother    Alcohol abuse Father    Drug abuse Father    Pulmonary disease Brother    Prostate cancer Neg Hx    Bladder Cancer Neg Hx    Kidney cancer Neg Hx     Social History    Socioeconomic History   Marital status: Married    Spouse name: Not on file   Number of children: 2   Years of education: Not on file   Highest education level: Bachelor's degree (e.g., BA, AB, BS)  Occupational History   Occupation: Sports coach    Comment: full time  Tobacco Use   Smoking status: Former    Pack years: 0.00    Types: Cigars   Smokeless tobacco: Never  Vaping Use   Vaping Use: Never used  Substance and Sexual Activity   Alcohol use: No    Alcohol/week: 0.0 standard drinks    Comment: had a history of alcohol abuse been sober >31 years   Drug use: No   Sexual activity: Not on file  Other Topics Concern   Not on file  Social History Narrative   Not on file   Social Determinants of Health   Financial Resource Strain: Not on file  Food Insecurity: Not on file  Transportation Needs: Not on file  Physical Activity: Not on file  Stress: Not on file  Social Connections: Not on file  Intimate Partner Violence: Not on file    Review of Systems: See HPI, otherwise negative ROS  Physical Exam: BP 132/75   Pulse (!) 59   Temp (!) 97.2 F (36.2 C) (Temporal)   Resp 17   Ht 6\' 1"  (1.854 m)  Wt 108 kg   SpO2 97%   BMI 31.40 kg/m  General:   Alert,  pleasant and cooperative in NAD Head:  Normocephalic and atraumatic. Neck:  Supple; no masses or thyromegaly. Lungs:  Clear throughout to auscultation.    Heart:  Regular rate and rhythm. Abdomen:  Soft, nontender and nondistended. Normal bowel sounds, without guarding, and without rebound.   Neurologic:  Alert and  oriented x4;  grossly normal neurologically.  Impression/Plan: Jeff Baker is here for an colonoscopy to be performed for a history of adenomatous polyps on 2017   Risks, benefits, limitations, and alternatives regarding  colonoscopy have been reviewed with the patient.  Questions have been answered.  All parties agreeable.   Lucilla Lame, MD  08/18/2020, 7:59 AM

## 2020-08-18 NOTE — Op Note (Signed)
[facility] Gastroenterology Patient Name: Jeff Baker Procedure Date: 08/18/2020 7:55 AM MRN: [id number] Account #: [id number] Date of Birth: [birth year] Admit Type: Outpatient Age: 75 Room: [facility] ENDO ROOM 2 Gender: Male Note Status: Finalized Procedure:             Colonoscopy Indications:           High risk colon cancer surveillance: Personal history                         of colonic polyps Providers:             Lucilla Lame MD, MD Referring MD:          Wendee Beavers. Terrilee Croak (Referring MD) Medicines:             Propofol per Anesthesia Complications:         No immediate complications. Procedure:             Pre-Anesthesia Assessment:                        - Prior to the procedure, a History and Physical was                         performed, and patient medications and allergies were                         reviewed. The patient's tolerance of previous                         anesthesia was also reviewed. The risks and benefits                         of the procedure and the sedation options and risks                         were discussed with the patient. All questions were                         answered, and informed consent was obtained. Prior                         Anticoagulants: The patient has taken no previous                         anticoagulant or antiplatelet agents. ASA Grade                         Assessment: II - A patient with mild systemic disease.                         After reviewing the risks and benefits, the patient                         was deemed in satisfactory condition to undergo the                         procedure.  After obtaining informed consent, the colonoscope was                         passed under direct vision. Throughout the procedure,                         the patient's blood pressure, pulse, and oxygen                         saturations were monitored continuously. The                          Colonoscope was introduced through the anus and                         advanced to the the cecum, identified by appendiceal                         orifice and ileocecal valve. The colonoscopy was                         performed without difficulty. The patient tolerated                         the procedure well. The quality of the bowel                         preparation was excellent. Findings:      The perianal and digital rectal examinations were normal.      A 3 mm polyp was found in the transverse colon. The polyp was sessile.       The polyp was removed with a cold biopsy forceps. Resection and       retrieval were complete.      A 3 mm polyp was found in the descending colon. The polyp was sessile.       The polyp was removed with a cold biopsy forceps. Resection and       retrieval were complete.      Non-bleeding internal hemorrhoids were found during retroflexion. The       hemorrhoids were Grade II (internal hemorrhoids that prolapse but reduce       spontaneously). Impression:            - One 3 mm polyp in the transverse colon, removed with                         a cold biopsy forceps. Resected and retrieved.                        - One 3 mm polyp in the descending colon, removed with                         a cold biopsy forceps. Resected and retrieved.                        - Non-bleeding internal hemorrhoids. Recommendation:        - Discharge patient to home.                        -  Resume previous diet.                        - Continue present medications.                        - Await pathology results.                        - Repeat colonoscopy is not recommended for                         surveillance. Procedure Code(s):     --- Professional ---                        [phone], Colonoscopy, flexible; with biopsy, single or                         multiple Diagnosis Code(s):     --- Professional ---                        Z86.010, Personal  history of colonic polyps                        K63.5, Polyp of colon CPT copyright 2019 American Medical Association. All rights reserved. The codes documented in this report are preliminary and upon coder review may  be revised to meet current compliance requirements. Lucilla Lame MD, MD 08/18/2020 8:24:47 AM This report has been signed electronically. Number of Addenda: 0 Note Initiated On: 08/18/2020 7:55 AM Scope Withdrawal Time: 0 hours 7 minutes 10 seconds  Total Procedure Duration: 0 hours 10 minutes 24 seconds  Estimated Blood Loss:  Estimated blood loss: none.      [facility]

## 2020-08-19 ENCOUNTER — Encounter: Payer: Self-pay | Admitting: Gastroenterology

## 2020-08-19 LAB — SURGICAL PATHOLOGY

## 2020-10-13 ENCOUNTER — Telehealth: Payer: Self-pay

## 2020-10-13 NOTE — Telephone Encounter (Signed)
Scheduled for 10/14/20

## 2020-10-13 NOTE — Telephone Encounter (Signed)
Copied from [place] [id number]. Topic: Appointment Scheduling - Scheduling Inquiry for Clinic >> Oct 13, 2020  2:46 PM Yvette Rack wrote: Reason for CRM: Pt called to schedule an appt asap due to some ear pain that is going down to his neck. Pt requests call back

## 2020-10-14 ENCOUNTER — Encounter: Payer: Self-pay | Admitting: Family Medicine

## 2020-10-14 ENCOUNTER — Other Ambulatory Visit: Payer: Self-pay

## 2020-10-14 ENCOUNTER — Ambulatory Visit: Payer: Medicare PPO | Admitting: Family Medicine

## 2020-10-14 VITALS — BP 136/63 | HR 59 | Temp 97.6°F | Wt 245.0 lb

## 2020-10-14 DIAGNOSIS — H6692 Otitis media, unspecified, left ear: Secondary | ICD-10-CM

## 2020-10-14 MED ORDER — AMOXICILLIN 500 MG PO CAPS: 1000.0000 mg | ORAL_CAPSULE | Freq: Two times a day (BID) | ORAL | 0 refills | Status: AC

## 2020-10-14 MED ORDER — NEOMYCIN-POLYMYXIN-HC 3.5-10000-1 OT SOLN: 3.0000 [drp] | Freq: Four times a day (QID) | OTIC | 0 refills | Status: AC

## 2020-10-14 NOTE — Progress Notes (Signed)
      Established patient visit   Patient: Jeff Baker   DOB: 01/16/1946   75 y.o. Male  MRN: [id number] Visit Date: 10/14/2020  Today's healthcare provider: Lelon Huh, MD   Chief Complaint  Patient presents with   Ear Pain   Subjective  -------------------------------------------------------------------------------------------------------------------- Otalgia  There is pain in the left ear. This is a new problem. The current episode started yesterday. The problem has been gradually worsening. There has been no fever. Associated symptoms include neck pain. Pertinent negatives include no abdominal pain, coughing, ear discharge, headaches, hearing loss, rhinorrhea or sore throat. He has tried NSAIDs for the symptoms.   Patient states the ear pain radiates down his neck.     Medications: Outpatient Medications Prior to Visit  Medication Sig   ibuprofen (ADVIL,MOTRIN) 600 MG tablet Take 1 tablet (600 mg total) by mouth every 8 (eight) hours as needed.   Multiple Vitamin (MULTIVITAMIN) capsule Take 1 capsule by mouth daily.    sertraline (ZOLOFT) 100 MG tablet Take 1 tablet (100 mg total) by mouth daily.   No facility-administered medications prior to visit.    Review of Systems  Constitutional: Negative.   HENT:  Positive for congestion, ear pain, postnasal drip, sinus pressure, sinus pain and tinnitus. Negative for ear discharge, hearing loss, rhinorrhea, sneezing and sore throat.   Eyes: Negative.   Respiratory: Negative.  Negative for cough.   Gastrointestinal: Negative.  Negative for abdominal pain.  Musculoskeletal:  Positive for neck pain.  Neurological:  Positive for light-headedness. Negative for dizziness and headaches.      Objective  -------------------------------------------------------------------------------------------------------------------- BP 136/63 (BP Location: Right Arm, Patient Position: Sitting, Cuff Size: Large)   Pulse (!) 59   Temp 97.6 F  (36.4 C) (Oral)   Wt 245 lb (111.1 kg)   SpO2 97%   BMI 32.32 kg/m     Physical Exam   General Appearance:    Mildly obese male, alert, cooperative, in no acute distress  HENT:   Mildly inflamed left ear canal and TM  Eyes:    PERRL, conjunctiva/corneas clear, EOM's intact       Lungs:     Clear to auscultation bilaterally, respirations unlabored  Heart:    Bradycardic. Normal rhythm. No murmurs, rubs, or gallops.   Neurologic:   Awake, alert, oriented x 3. No apparent focal neurological           defect.        Assessment & Plan  ---------------------------------------------------------------------------------------------------------------------- 1. Left otitis media, unspecified otitis media type  - amoxicillin (AMOXIL) 500 MG capsule; Take 2 capsules (1,000 mg total) by mouth 2 (two) times daily for 7 days.  Dispense: 28 capsule; Refill: 0 - neomycin-polymyxin-hydrocortisone (CORTISPORIN) OTIC solution; Place 3 drops into the left ear 4 (four) times daily for 7 days.  Dispense: 10 mL; Refill: 0   Call if symptoms change or if not rapidly improving.         The entirety of the information documented in the History of Present Illness, Review of Systems and Physical Exam were personally obtained by me. Portions of this information were initially documented by the CMA and reviewed by me for thoroughness and accuracy.     Lelon Huh, MD  [facility] [phone] (phone) [phone] (fax)  [place]

## 2020-12-15 ENCOUNTER — Telehealth: Payer: Self-pay

## 2020-12-15 NOTE — Telephone Encounter (Signed)
Copied from [place] [id number]. Topic: Appointment Scheduling - Scheduling Inquiry for Clinic >> Dec 15, 2020 11:53 AM Erick Blinks wrote: Reason for CRM: Patient would like to have flu shot and bivalent booster during his appt on Monday  Best contact: [phone]

## 2020-12-15 NOTE — Telephone Encounter (Signed)
Patient advised of flu vaccine availability only.

## 2020-12-21 ENCOUNTER — Other Ambulatory Visit: Payer: Self-pay

## 2020-12-21 ENCOUNTER — Ambulatory Visit: Payer: Medicare PPO | Admitting: Family Medicine

## 2020-12-21 ENCOUNTER — Encounter: Payer: Self-pay | Admitting: Family Medicine

## 2020-12-21 VITALS — BP 126/76 | HR 56 | Temp 97.6°F | Resp 16 | Wt 249.8 lb

## 2020-12-21 DIAGNOSIS — F419 Anxiety disorder, unspecified: Secondary | ICD-10-CM

## 2020-12-21 DIAGNOSIS — R7303 Prediabetes: Secondary | ICD-10-CM

## 2020-12-21 DIAGNOSIS — E7800 Pure hypercholesterolemia, unspecified: Secondary | ICD-10-CM | POA: Diagnosis not present

## 2020-12-21 DIAGNOSIS — E559 Vitamin D deficiency, unspecified: Secondary | ICD-10-CM | POA: Insufficient documentation

## 2020-12-21 DIAGNOSIS — Z23 Encounter for immunization: Secondary | ICD-10-CM | POA: Diagnosis not present

## 2020-12-21 DIAGNOSIS — F411 Generalized anxiety disorder: Secondary | ICD-10-CM

## 2020-12-21 LAB — POCT GLYCOSYLATED HEMOGLOBIN (HGB A1C)
Est. average glucose Bld gHb Est-mCnc: 120
Hemoglobin A1C: 5.8 % — AB (ref 4.0–5.6)

## 2020-12-21 MED ORDER — SERTRALINE HCL 100 MG PO TABS: 100.0000 mg | ORAL_TABLET | Freq: Every day | ORAL | 1 refills | Status: DC

## 2020-12-21 NOTE — Assessment & Plan Note (Signed)
Chronic and well controlled Continue Zoloft at current dose 

## 2020-12-21 NOTE — Progress Notes (Signed)
Established patient visit   Patient: Jeff Baker   DOB: [birth year]   75 y.o. Male  MRN: [id number] Visit Date: 12/21/2020  Today's healthcare provider: Lavon Paganini, MD   Chief Complaint  Patient presents with   Anxiety   Hyperglycemia   vitamin d deficiency    Subjective    HPI  Anxiety, Follow-up  He was last seen for anxiety 6 months ago. Changes made at last visit include no changes.   He reports excellent compliance with treatment. He reports excellent tolerance of treatment. He is not having side effects.   He feels his anxiety is moderate and Unchanged since last visit.  Symptoms: No chest pain No difficulty concentrating  No dizziness No fatigue  No feelings of losing control No insomnia  No irritable No palpitations  No panic attacks No racing thoughts  No shortness of breath No sweating  No tremors/shakes    GAD-7 Results GAD-7 Generalized Anxiety Disorder Screening Tool 12/21/2020  1. Feeling Nervous, Anxious, or on Edge 2  2. Not Being Able to Stop or Control Worrying 1  3. Worrying Too Much About Different Things 1  4. Trouble Relaxing 0  5. Being So Restless it's Hard To Sit Still 0  6. Becoming Easily Annoyed or Irritable 1  7. Feeling Afraid As If Something Awful Might Happen 1  Total GAD-7 Score 6  Difficulty At Work, Home, or Getting  Along With Others? Not difficult at all    PH-9 Scores PHQ9 SCORE ONLY 12/21/2020 10/14/2020 06/17/2020  PHQ-9 Total Score 1 1 1     --------------------------------------------------------------------------------------------------- Prediabetes, Follow-up  Lab Results  Component Value Date   HGBA1C 5.8 (A) 12/21/2020   HGBA1C 6.0 (H) 06/17/2020   HGBA1C 5.9 (A) 10/23/2019   GLUCOSE 101 (H) 06/17/2020   GLUCOSE 100 (H) 01/01/2020   GLUCOSE 118 (H) 04/24/2019    Last seen for this 6 months ago.  Management since that visit includes no changes. Current symptoms include none and have been  improving.  Prior visit with dietician: no Current diet: in general, a "healthy" diet   Current exercise: walking and fly fish  Pertinent Labs:    Component Value Date/Time   CREATININE 0.91 06/17/2020 1348    Wt Readings from Last 3 Encounters:  12/21/20 249 lb 12.8 oz (113.3 kg)  10/14/20 245 lb (111.1 kg)  08/18/20 238 lb (108 kg)    ----------------------------------------------------------------------------------------- Follow up for vitamin D deficiency  The patient was last seen for this 6 months ago. Changes made at last visit include start OTC Vitamin D 2000 IU daily.  He reports excellent compliance with treatment. Patient reports taking 3000 or 5000 IU daily.  He feels that condition is Improved. He is not having side effects.   -----------------------------------------------------------------------------------------  Lipid/Cholesterol, Follow-up  Last lipid panel Other pertinent labs  No results found for: CHOL, HDL, LDLCALC, LDLDIRECT, TRIG, CHOLHDL Lab Results  Component Value Date   ALT 16 06/17/2020   AST 16 06/17/2020   PLT 220 06/17/2020   TSH 2.580 06/17/2020     He was last seen for this 6 months ago.  Management since that visit includes no changes. Patient does not want a lipid panel done.   No oral medication.  Symptoms: No chest pain No chest pressure/discomfort  No dyspnea No lower extremity edema  No numbness or tingling of extremity No orthopnea  No palpitations No paroxysmal nocturnal dyspnea  No speech difficulty No syncope   Current  diet: in general, a "healthy" diet   vegetarian diet. Current exercise: walking  The ASCVD Risk score (Arnett DK, et al., 2019) failed to calculate for the following reasons:   Cannot find a previous HDL lab   Cannot find a previous total cholesterol lab  ---------------------------------------------------------------------------------------------------    Medications: Outpatient Medications  Prior to Visit  Medication Sig   ibuprofen (ADVIL,MOTRIN) 600 MG tablet Take 1 tablet (600 mg total) by mouth every 8 (eight) hours as needed.   Multiple Vitamin (MULTIVITAMIN) capsule Take 1 capsule by mouth daily.    [DISCONTINUED] sertraline (ZOLOFT) 100 MG tablet Take 1 tablet (100 mg total) by mouth daily.   No facility-administered medications prior to visit.    Review of Systems  Constitutional:  Negative for activity change, appetite change and fatigue.  Respiratory:  Negative for chest tightness and shortness of breath.   Cardiovascular:  Negative for chest pain.   Last vitamin D Lab Results  Component Value Date   VD25OH 17.3 (L) 06/17/2020       Objective    BP 126/76 (BP Location: Left Arm, Patient Position: Sitting, Cuff Size: Large)   Pulse (!) 56   Temp 97.6 F (36.4 C) (Temporal)   Resp 16   Wt 249 lb 12.8 oz (113.3 kg)   SpO2 97%   BMI 32.96 kg/m  BP Readings from Last 3 Encounters:  12/21/20 126/76  10/14/20 136/63  08/18/20 133/72   Wt Readings from Last 3 Encounters:  12/21/20 249 lb 12.8 oz (113.3 kg)  10/14/20 245 lb (111.1 kg)  08/18/20 238 lb (108 kg)      Physical Exam Vitals reviewed.  Constitutional:      General: He is not in acute distress.    Appearance: Normal appearance. He is not diaphoretic.  HENT:     Head: Normocephalic and atraumatic.  Eyes:     General: No scleral icterus.    Conjunctiva/sclera: Conjunctivae normal.  Cardiovascular:     Rate and Rhythm: Normal rate and regular rhythm.     Pulses: Normal pulses.     Heart sounds: Normal heart sounds. No murmur heard. Pulmonary:     Effort: Pulmonary effort is normal. No respiratory distress.     Breath sounds: Normal breath sounds. No wheezing or rhonchi.  Abdominal:     General: There is no distension.     Palpations: Abdomen is soft.     Tenderness: There is no abdominal tenderness.  Musculoskeletal:     Cervical back: Neck supple.     Right lower leg: No edema.      Left lower leg: No edema.  Lymphadenopathy:     Cervical: No cervical adenopathy.  Skin:    General: Skin is warm and dry.     Capillary Refill: Capillary refill takes less than 2 seconds.     Findings: No rash.  Neurological:     Mental Status: He is alert and oriented to person, place, and time.     Cranial Nerves: No cranial nerve deficit.  Psychiatric:        Mood and Affect: Mood normal.        Behavior: Behavior normal.      Results for orders placed or performed in visit on 12/21/20  POCT glycosylated hemoglobin (Hb A1C)  Result Value Ref Range   Hemoglobin A1C 5.8 (A) 4.0 - 5.6 %   Est. average glucose Bld gHb Est-mCnc 120     Assessment & Plan  Problem List Items Addressed This Visit       Other   Anxiety, generalized   Relevant Medications   sertraline (ZOLOFT) 100 MG tablet   Pure hypercholesterolemia    Declines lipid panel - states he will never take a statin and does not need to check it - discussed ASCVD risk Not currently on a statin Discussed diet and exercise       Anxiety - Primary    Chronic and well controlled Continue Zoloft at current dose      Relevant Medications   sertraline (ZOLOFT) 100 MG tablet   Prediabetes    Recommend low carb diet Recheck A1c       Relevant Orders   POCT glycosylated hemoglobin (Hb A1C) (Completed)   Avitaminosis D    Continue supplement Recheck level today      Relevant Orders   VITAMIN D 25 Hydroxy (Vit-D Deficiency, Fractures)   Other Visit Diagnoses     Need for influenza vaccination       Relevant Orders   Flu Vaccine QUAD High Dose(Fluad) (Completed)        Return in about 6 months (around 06/20/2021) for AWV, CPE, With new PCP.      I, Lavon Paganini, MD, have reviewed all documentation for this visit. The documentation on 12/21/20 for the exam, diagnosis, procedures, and orders are all accurate and complete.   Issaih Kaus, Dionne Bucy, MD, MPH [facility]

## 2020-12-21 NOTE — Assessment & Plan Note (Addendum)
Declines lipid panel - states he will never take a statin and does not need to check it - discussed ASCVD risk Not currently on a statin Discussed diet and exercise

## 2020-12-21 NOTE — Assessment & Plan Note (Signed)
Recommend low carb diet °Recheck A1c  °

## 2020-12-21 NOTE — Assessment & Plan Note (Signed)
Continue supplement Recheck level today

## 2021-04-02 ENCOUNTER — Encounter: Payer: Self-pay | Admitting: Family Medicine

## 2021-04-02 DIAGNOSIS — U071 COVID-19: Secondary | ICD-10-CM

## 2021-04-02 MED ORDER — NIRMATRELVIR/RITONAVIR (PAXLOVID)TABLET: 3.0000 | ORAL_TABLET | Freq: Two times a day (BID) | ORAL | 0 refills | Status: AC

## 2021-04-08 IMAGING — CT CT RENAL STONE PROTOCOL
2 of 4 series · 16 of 46 positions shown, 18 images · non-contrast
Comparison: None.

CLINICAL DATA: 74-year-old male with left flank pain. Concern for
kidney stone.

EXAM:
CT ABDOMEN AND PELVIS WITHOUT CONTRAST
TECHNIQUE: Multidetector CT imaging of the abdomen and pelvis was performed
following the standard protocol without IV contrast.

[Series 2: renal stone 5.00 · axial · 0.95mm/px · z∈[-1569,-1079]mm · 13 of 108 slices shown, 15 images]
[im 5/108  soft-tissue]
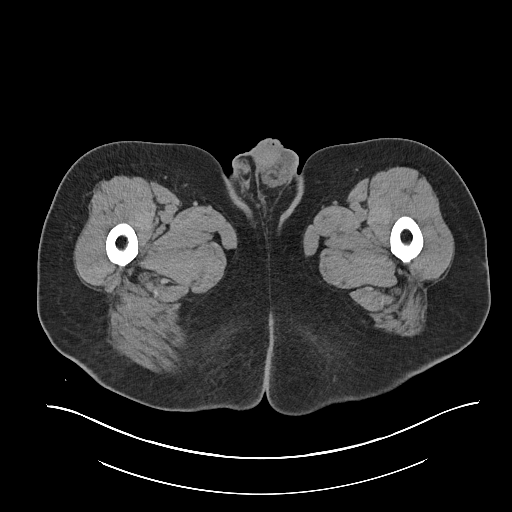
[im 5/108  bone]
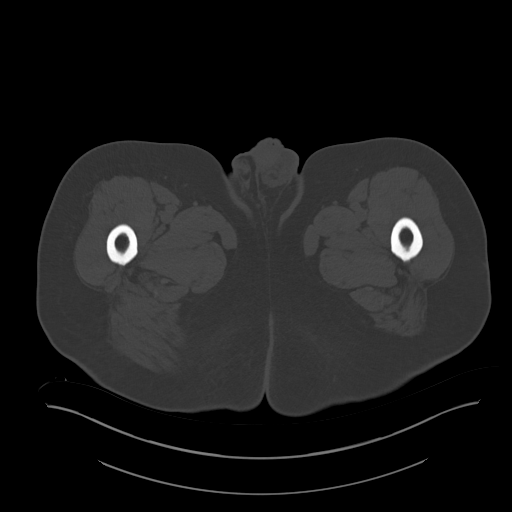
[im 13/108  soft-tissue]
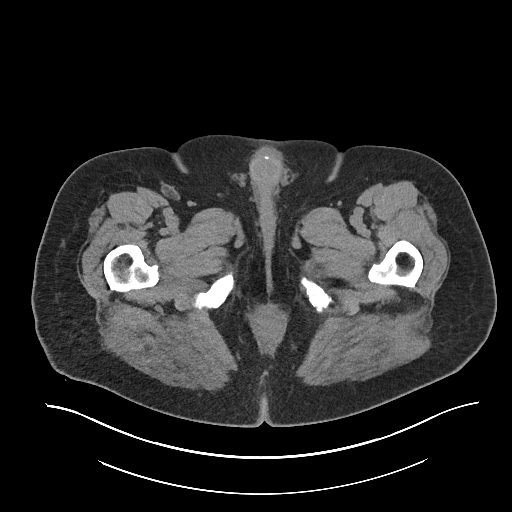
[im 22/108  soft-tissue]
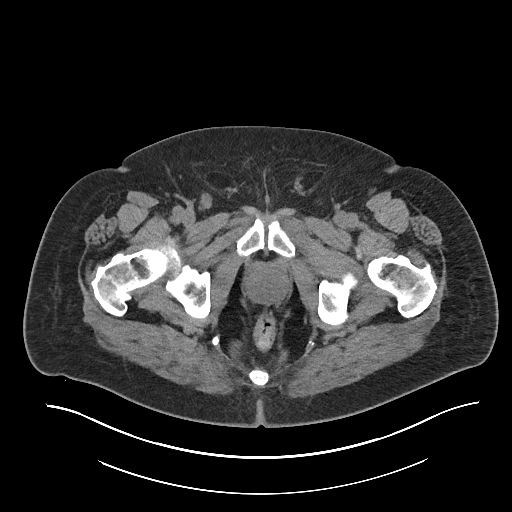
[im 30/108  soft-tissue]
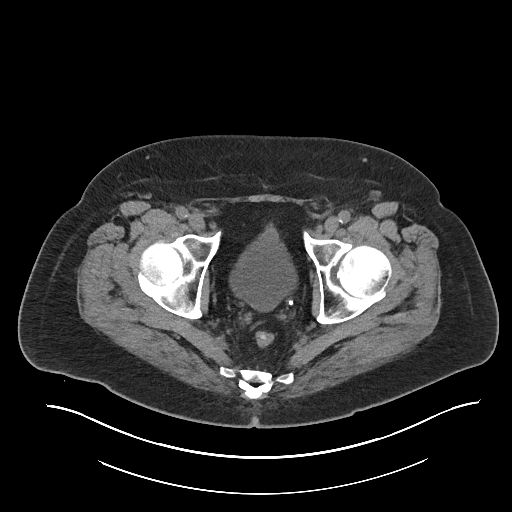
[im 39/108  soft-tissue]
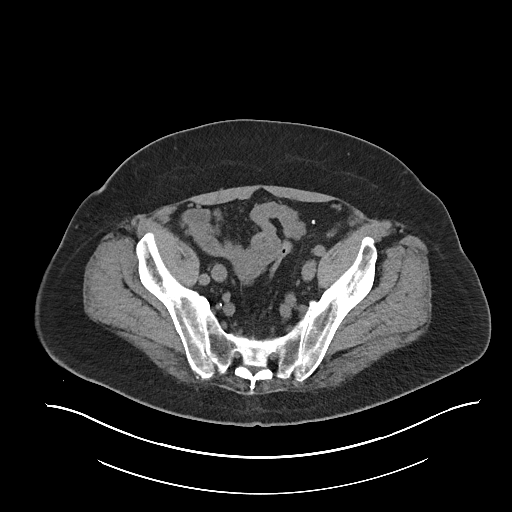
[im 48/108  soft-tissue]
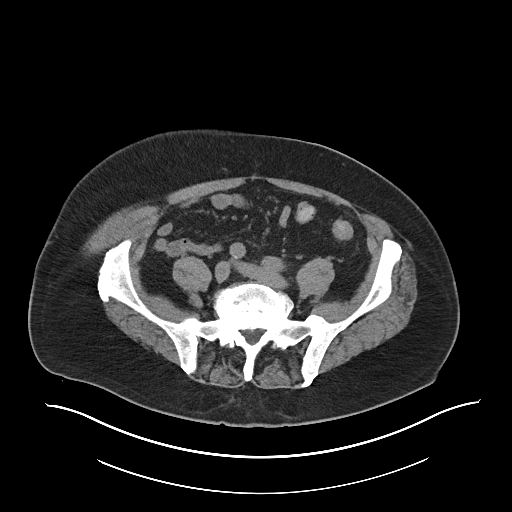
[im 56/108  soft-tissue]
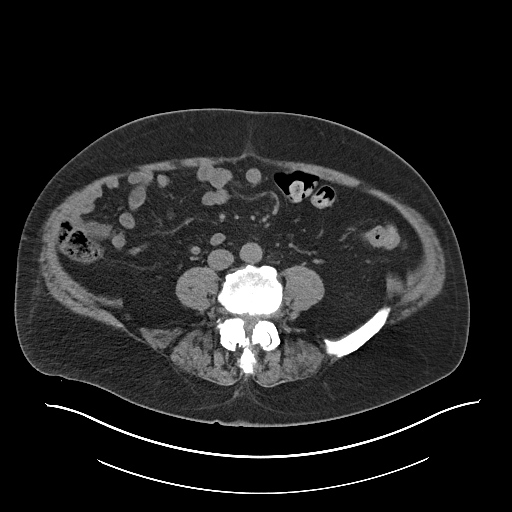
[im 60/108  soft-tissue]
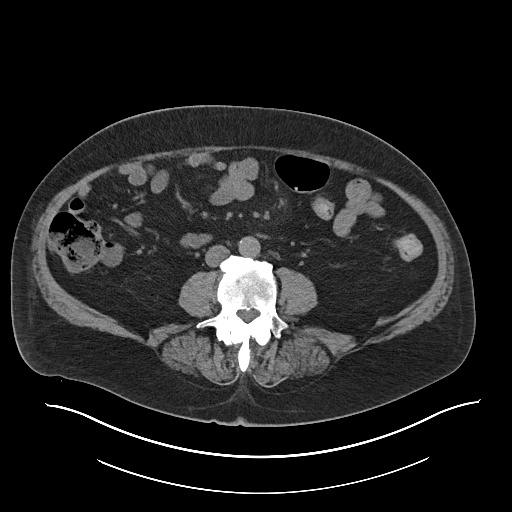
[im 69/108  soft-tissue]
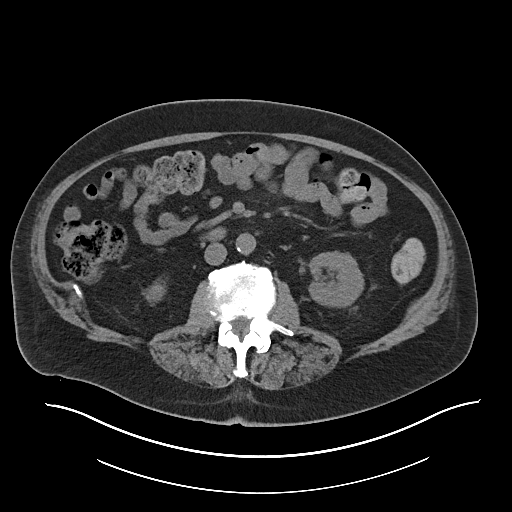
[im 69/108  bone]
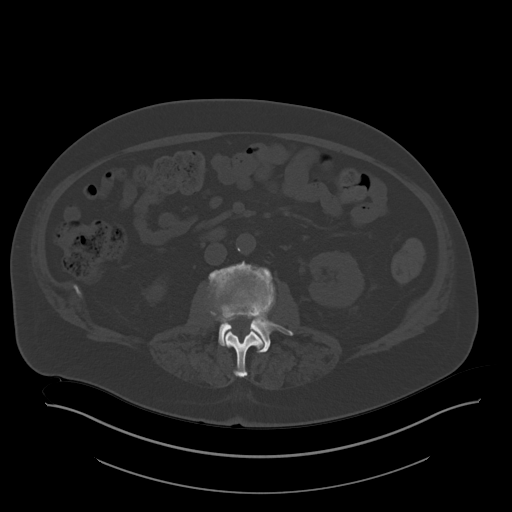
[im 78/108  soft-tissue]
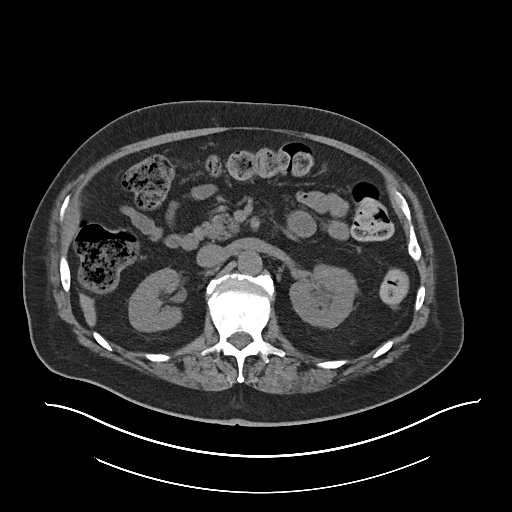
[im 86/108  soft-tissue]
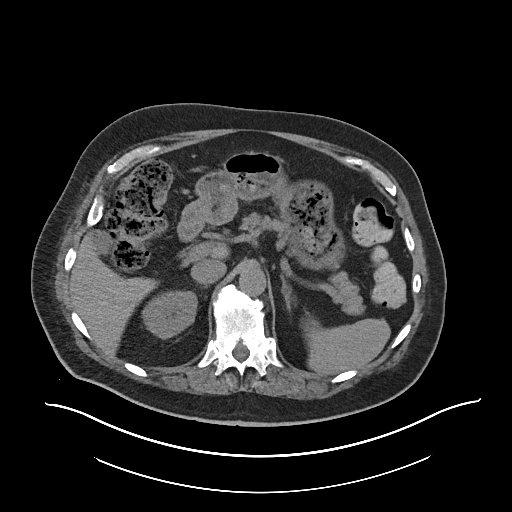
[im 95/108  soft-tissue]
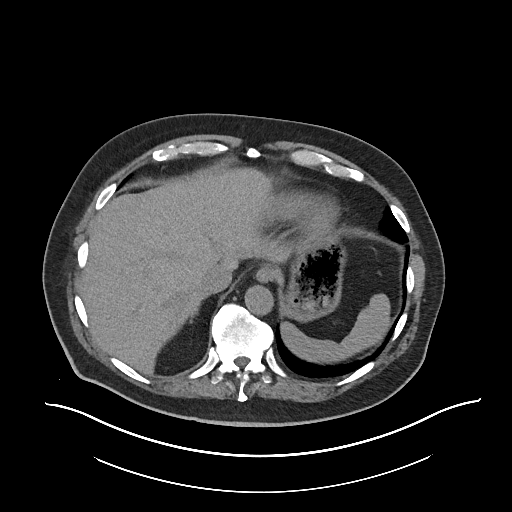
[im 103/108  soft-tissue]
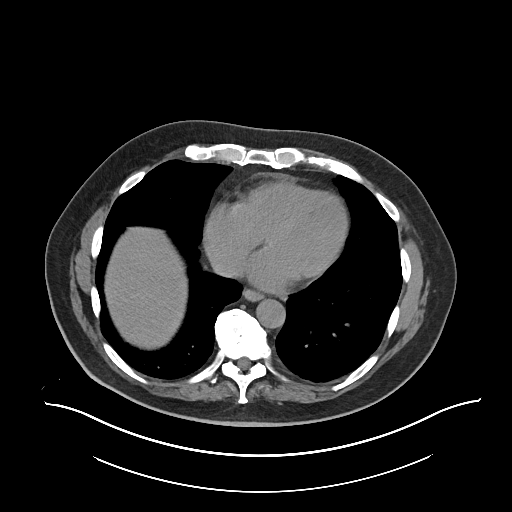

[Series 4: renal stone 2.00 cor · coronal · 0.90mm/px · 3 of 160 slices shown]
[im 54/160  soft-tissue]
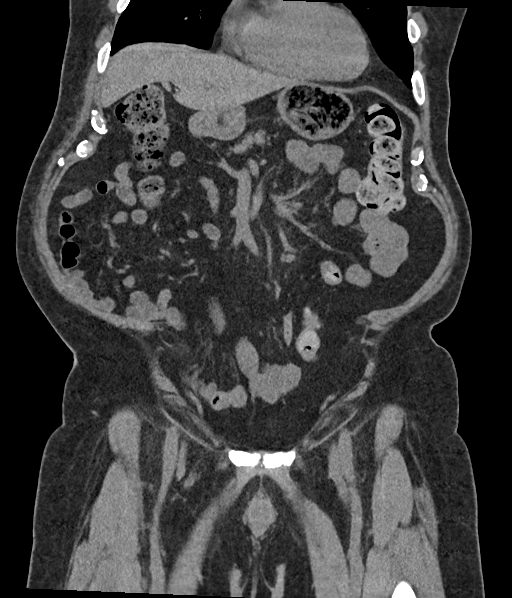
[im 71/160  soft-tissue]
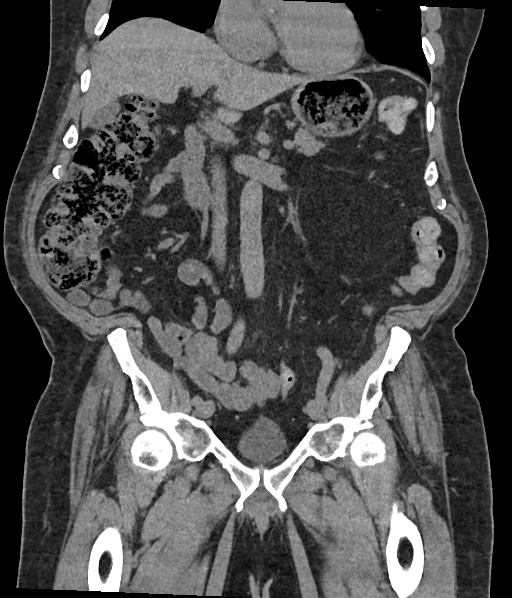
[im 89/160  soft-tissue]
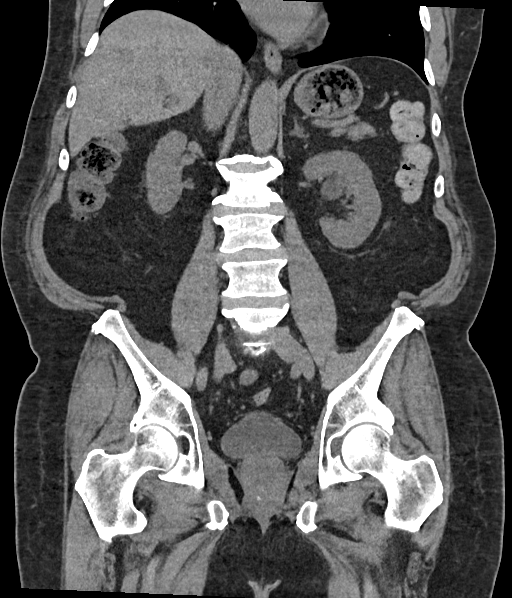

[16 of 46 positions shown; findings below may reference images not displayed]

FINDINGS: Evaluation of this exam is limited in the absence of intravenous
contrast.

Lower chest: The visualized lung bases are clear.

No intra-abdominal free air or free fluid.

Hepatobiliary: The liver is unremarkable. No intrahepatic biliary
ductal dilatation. The gallbladder is unremarkable.

Pancreas: Unremarkable. No pancreatic ductal dilatation or
surrounding inflammatory changes.

Spleen: Normal in size without focal abnormality.

Adrenals/Urinary Tract: The adrenal glands unremarkable. The right
kidney is unremarkable. There is a 4 mm nonobstructing stone in the
interpolar aspect of the left kidney and a 5 mm left renal inferior
pole nonobstructing stone. There is mild left renal upper pole
caliectasis versus parapelvic cyst. There is a string of small
adjacent stones in the distal left ureter each measuring
approximately 2-3 mm and a combined length of approximately 13 mm
(coronal 97/4). The right ureter is unremarkable. The urinary
bladder is partially distended and grossly unremarkable.

Stomach/Bowel: There is moderate stool throughout the colon. There
is no bowel obstruction or active inflammation. The appendix is
normal.

Vascular/Lymphatic: The abdominal aorta and IVC unremarkable. No
portal venous gas. There is no adenopathy.

Reproductive: The prostate and seminal vesicles are grossly
unremarkable. No pelvic mass.

Other: Small fat containing umbilical hernia

Musculoskeletal: Degenerative changes of the spine. No acute osseous
pathology.
IMPRESSION: 1. A string of small adjacent stones in the distal left ureter with
mild left renal upper pole caliectasis versus parapelvic cyst.
Additional nonobstructing left renal calculi noted.
2. No bowel obstruction. Normal appendix.

## 2021-06-17 ENCOUNTER — Encounter: Payer: Self-pay | Admitting: Family Medicine

## 2021-06-18 ENCOUNTER — Encounter: Payer: Self-pay | Admitting: Family Medicine

## 2021-06-18 ENCOUNTER — Ambulatory Visit (INDEPENDENT_AMBULATORY_CARE_PROVIDER_SITE_OTHER): Payer: Medicare PPO | Admitting: Family Medicine

## 2021-06-18 VITALS — BP 112/47 | HR 85 | Temp 98.6°F | Resp 16 | Ht 73.0 in | Wt 249.3 lb

## 2021-06-18 DIAGNOSIS — R7303 Prediabetes: Secondary | ICD-10-CM | POA: Diagnosis not present

## 2021-06-18 DIAGNOSIS — F1021 Alcohol dependence, in remission: Secondary | ICD-10-CM | POA: Diagnosis not present

## 2021-06-18 DIAGNOSIS — F411 Generalized anxiety disorder: Secondary | ICD-10-CM

## 2021-06-18 DIAGNOSIS — Z0000 Encounter for general adult medical examination without abnormal findings: Secondary | ICD-10-CM | POA: Insufficient documentation

## 2021-06-18 DIAGNOSIS — E559 Vitamin D deficiency, unspecified: Secondary | ICD-10-CM

## 2021-06-18 DIAGNOSIS — R399 Unspecified symptoms and signs involving the genitourinary system: Secondary | ICD-10-CM | POA: Insufficient documentation

## 2021-06-18 MED ORDER — SERTRALINE HCL 25 MG PO TABS: 25.0000 mg | ORAL_TABLET | Freq: Every day | ORAL | 1 refills | Status: DC

## 2021-06-18 MED ORDER — SERTRALINE HCL 100 MG PO TABS: 100.0000 mg | ORAL_TABLET | Freq: Every day | ORAL | 1 refills | Status: DC

## 2021-06-18 NOTE — Assessment & Plan Note (Signed)
UTD on vision ?Denies routine dental care ?Things to do to keep yourself healthy  ?- Exercise at least 30-45 minutes a day, 3-4 days a week.  ?- Eat a low-fat diet with lots of fruits and vegetables, up to 7-9 servings per day.  ?- Seatbelts can save your life. Wear them always.  ?- Smoke detectors on every level of your home, check batteries every year.  ?- Eye Doctor - have an eye exam every 1-2 years  ?- Safe sex - if you may be exposed to STDs, use a condom.  ?- Alcohol -  If you drink, do it moderately, less than 2 drinks per day.  ?- [place]. Choose someone to speak for you if you are not able.  ?- Depression is common in our stressful world.If you're feeling down or losing interest in things you normally enjoy, please come in for a visit.  ?- Violence - If anyone is threatening or hurting you, please call immediately. ? ? ?

## 2021-06-18 NOTE — Assessment & Plan Note (Signed)
Chronic, previous stable with lifestyle management ?Repeat A1c today  ?Continue to recommend balanced, lower carb meals. Smaller meal size, adding snacks. Choosing water as drink of choice and increasing purposeful exercise. ? ?

## 2021-06-18 NOTE — Assessment & Plan Note (Signed)
Currently sober for 46 years ?Congratulated ?

## 2021-06-18 NOTE — Assessment & Plan Note (Signed)
Chronic, worsening ?Request for increase of SSRI to assist ?Hx of agoraphobia; patient has been avoiding certain plans d/t exacerbation ?Denies SI or HI ?Denies self harm ?Previously self medicated anxiety with excessive alcohol intake ?RTC 2 months for follow up  ?

## 2021-06-18 NOTE — Progress Notes (Signed)
? ?[employer] as a Education administrator for Gwyneth Sprout, FNP.,have documented all relevant documentation on the behalf of Gwyneth Sprout, FNP,as directed by  Gwyneth Sprout, FNP while in the presence of Gwyneth Sprout, FNP.  ? ?Annual Wellness Visit ? ?Patient: Jeff Baker, Male    DOB: 12-23-1945, 76 y.o.   MRN: [id number] ?Visit Date: 06/18/2021 ? ?Today's Provider: Gwyneth Sprout, FNP  ? ?Introduced to Designer, jewellery role and practice setting.  All questions answered.  Discussed provider/patient relationship and expectations. ? ?Chief Complaint  ?Patient presents with  ? Medicare Wellness  ? ?Subjective  ?  ?Jeff Baker is a 76 y.o. male who presents today for his Annual Wellness Visit. ?He reports consuming a general diet. Home exercise routine includes walking twice a week and fishing once a week. He generally feels well. He reports sleeping fairly well. He does not have additional problems to discuss today.  ? ?HPI ? ?Medications: ?Outpatient Medications Prior to Visit  ?Medication Sig  ? ibuprofen (ADVIL,MOTRIN) 600 MG tablet Take 1 tablet (600 mg total) by mouth every 8 (eight) hours as needed.  ? Multiple Vitamin (MULTIVITAMIN) capsule Take 1 capsule by mouth daily.   ? [DISCONTINUED] sertraline (ZOLOFT) 100 MG tablet Take 1 tablet (100 mg total) by mouth daily.  ? ?No facility-administered medications prior to visit.  ?  ?Allergies  ?Allergen Reactions  ? Bactrim [Sulfamethoxazole-Trimethoprim]   ?  Reports mild skin rash, stomach cramps and diarrhea.  ? ? ?Patient Care Team: ?Gwyneth Sprout, FNP as PCP - General (Family Medicine) ?Pa, [facility]) ? ?Review of Systems  ?HENT:  Positive for congestion, postnasal drip, rhinorrhea, sinus pressure, sneezing and tinnitus.   ?Eyes:  Positive for visual disturbance.  ?Endocrine: Positive for heat intolerance.  ?Genitourinary:  Positive for difficulty urinating.  ?Musculoskeletal:  Positive for arthralgias, back pain and neck pain.   ?Psychiatric/Behavioral:  The patient is nervous/anxious.   ?All other systems reviewed and are negative. ? ? ?  ? Objective  ?  ?Vitals: BP (!) 112/47   Pulse 85   Temp 98.6 ?F (37 ?C) (Oral)   Resp 16   Ht '6\' 1"'$  (1.854 m)   Wt 249 lb 4.8 oz (113.1 kg)   SpO2 95%   BMI 32.89 kg/m?  ? ? ? ?Physical Exam ?Vitals and nursing note reviewed.  ?Constitutional:   ?   General: He is awake. He is not in acute distress. ?   Appearance: Normal appearance. He is well-developed and well-groomed. He is obese. He is not ill-appearing, toxic-appearing or diaphoretic.  ?HENT:  ?   Head: Normocephalic and atraumatic.  ?   Jaw: There is normal jaw occlusion. No trismus, tenderness, swelling or pain on movement.  ?   Salivary Glands: Right salivary gland is not diffusely enlarged or tender. Left salivary gland is not diffusely enlarged or tender.  ?   Right Ear: Tympanic membrane, ear canal and external ear normal. Decreased hearing noted. There is no impacted cerumen.  ?   Left Ear: Tympanic membrane, ear canal and external ear normal. Decreased hearing noted. There is no impacted cerumen.  ?   Nose: Nose normal. No congestion or rhinorrhea.  ?   Right Turbinates: Not enlarged, swollen or pale.  ?   Left Turbinates: Not enlarged, swollen or pale.  ?   Right Sinus: No maxillary sinus tenderness or frontal sinus tenderness.  ?   Left Sinus: No maxillary sinus  tenderness or frontal sinus tenderness.  ?   Mouth/Throat:  ?   Lips: Pink.  ?   Mouth: Mucous membranes are moist. No injury, lacerations, oral lesions or angioedema.  ?   Pharynx: Oropharynx is clear. Uvula midline. No pharyngeal swelling, oropharyngeal exudate or posterior oropharyngeal erythema.  ?   Tonsils: No tonsillar exudate or tonsillar abscesses.  ?Eyes:  ?   General: Lids are normal. Vision grossly intact. Gaze aligned appropriately.     ?   Right eye: No discharge.     ?   Left eye: No discharge.  ?   Extraocular Movements: Extraocular movements intact.  ?    Conjunctiva/sclera: Conjunctivae normal.  ?   Pupils: Pupils are equal, round, and reactive to light.  ?Neck:  ?   Thyroid: No thyroid mass, thyromegaly or thyroid tenderness.  ?   Vascular: No carotid bruit.  ?   Trachea: Trachea normal. No tracheal tenderness.  ?Cardiovascular:  ?   Rate and Rhythm: Normal rate and regular rhythm.  ?   Pulses: Normal pulses.     ?     Carotid pulses are 2+ on the right side and 2+ on the left side. ?     Radial pulses are 2+ on the right side and 2+ on the left side.  ?     Femoral pulses are 2+ on the right side and 2+ on the left side. ?     Popliteal pulses are 2+ on the right side and 2+ on the left side.  ?     Dorsalis pedis pulses are 2+ on the right side and 2+ on the left side.  ?     Posterior tibial pulses are 2+ on the right side and 2+ on the left side.  ?   Heart sounds: Normal heart sounds, S1 normal and S2 normal. No murmur heard. ?  No friction rub. No gallop.  ?Pulmonary:  ?   Effort: Pulmonary effort is normal. No respiratory distress.  ?   Breath sounds: Normal breath sounds and air entry. No stridor. No wheezing, rhonchi or rales.  ?Chest:  ?   Chest wall: No tenderness.  ?Abdominal:  ?   General: Abdomen is flat. Bowel sounds are normal. There is no distension.  ?   Palpations: Abdomen is soft. There is no mass.  ?   Tenderness: There is no abdominal tenderness. There is no guarding or rebound.  ?   Hernia: No hernia is present.  ?Genitourinary: ?   Comments: Exam deferred; some complaints of LUTS; denies PSA testing due to age ?Musculoskeletal:     ?   General: No swelling, tenderness, deformity or signs of injury. Normal range of motion.  ?   Cervical back: Normal range of motion and neck supple. No rigidity or tenderness.  ?   Right lower leg: No edema.  ?   Left lower leg: No edema.  ?Lymphadenopathy:  ?   Cervical: No cervical adenopathy.  ?   Right cervical: No superficial, deep or posterior cervical adenopathy. ?   Left cervical: No superficial, deep  or posterior cervical adenopathy.  ?Skin: ?   General: Skin is warm and dry.  ?   Capillary Refill: Capillary refill takes less than 2 seconds.  ?   Coloration: Skin is not jaundiced or pale.  ?   Findings: No bruising, erythema, lesion or rash.  ?Neurological:  ?   General: No focal deficit present.  ?   Mental  Status: He is alert and oriented to person, place, and time. Mental status is at baseline.  ?   GCS: GCS eye subscore is 4. GCS verbal subscore is 5. GCS motor subscore is 6.  ?   Sensory: Sensation is intact. No sensory deficit.  ?   Motor: Motor function is intact. No weakness.  ?   Coordination: Coordination is intact.  ?   Gait: Gait is intact.  ?Psychiatric:     ?   Attention and Perception: Attention and perception normal.     ?   Mood and Affect: Mood and affect normal.     ?   Speech: Speech normal.     ?   Behavior: Behavior normal. Behavior is cooperative.     ?   Thought Content: Thought content normal.     ?   Cognition and Memory: Cognition normal.     ?   Judgment: Judgment normal.  ? ?Most recent functional status assessment: ? ?  06/18/2021  ?  8:47 AM  ?In your present state of health, do you have any difficulty performing the following activities:  ?Hearing? 0  ?Vision? 0  ?Difficulty concentrating or making decisions? 0  ?Walking or climbing stairs? 0  ?Dressing or bathing? 0  ?Doing errands, shopping? 0  ? ?Most recent fall risk assessment: ? ?  06/17/2021  ?  2:22 PM  ?Fall Risk   ?Falls in the past year? 0  ?Number falls in past yr: 0  ? ? Most recent depression screenings: ? ?  06/18/2021  ?  8:45 AM 12/21/2020  ?  8:50 AM  ?PHQ 2/9 Scores  ?PHQ - 2 Score 0 0  ?PHQ- 9 Score 1 1  ? ?Most recent cognitive screening: ? ?  06/11/2019  ? 11:55 AM  ?6CIT Screen  ?What Year? 0 points  ?What month? 0 points  ?What time? 0 points  ?Count back from 20 0 points  ?Months in reverse 0 points  ?Repeat phrase 2 points  ?Total Score 2 points  ? ?Most recent Audit-C alcohol use screening ? ?  06/18/2021  ?   8:47 AM  ?Alcohol Use Disorder Test (AUDIT)  ?1. How often do you have a drink containing alcohol? 0  ?2. How many drinks containing alcohol do you have on a typical day when you are drinking? 0  ?3. How often

## 2021-06-18 NOTE — Assessment & Plan Note (Signed)
Declines wish to complete screening lipid panel or PSA ?Additional labs completed at this visit ?

## 2021-06-18 NOTE — Assessment & Plan Note (Signed)
Chronic, previously unstable ?Repeat level today ?Recommend use of appropriate supplement if level remains low ?

## 2021-06-18 NOTE — Assessment & Plan Note (Signed)
Complaints of urinary frequency at night, up 2 times ?Difficulty starting and stopping a stream, especially upon awakening ?Occasional complaints of poor emptying, typically after use of diuretic drink like tea ?Declines PSA d/t age ?Declines DRE ?Would not plan to complete radiation or chemotherapy if he had prostate cancer  ?

## 2021-06-19 LAB — CBC WITH DIFFERENTIAL/PLATELET
Basophils Absolute: 0.1 10*3/uL (ref 0.0–0.2)
Basos: 1 %
EOS (ABSOLUTE): 0.2 10*3/uL (ref 0.0–0.4)
Eos: 4 %
Hematocrit: 43.8 % (ref 37.5–51.0)
Hemoglobin: 14.6 g/dL (ref 13.0–17.7)
Immature Grans (Abs): 0 10*3/uL (ref 0.0–0.1)
Immature Granulocytes: 0 %
Lymphocytes Absolute: 1.6 10*3/uL (ref 0.7–3.1)
Lymphs: 31 %
MCH: 29.7 pg (ref 26.6–33.0)
MCHC: 33.3 g/dL (ref 31.5–35.7)
MCV: 89 fL (ref 79–97)
Monocytes Absolute: 0.5 10*3/uL (ref 0.1–0.9)
Monocytes: 10 %
Neutrophils Absolute: 2.8 10*3/uL (ref 1.4–7.0)
Neutrophils: 54 %
Platelets: 214 10*3/uL (ref 150–450)
RBC: 4.92 x10E6/uL (ref 4.14–5.80)
RDW: 13.2 % (ref 11.6–15.4)
WBC: 5.2 10*3/uL (ref 3.4–10.8)

## 2021-06-19 LAB — COMPREHENSIVE METABOLIC PANEL
ALT: 20 IU/L (ref 0–44)
AST: 19 IU/L (ref 0–40)
Albumin/Globulin Ratio: 2.1 (ref 1.2–2.2)
Albumin: 4.6 g/dL (ref 3.7–4.7)
Alkaline Phosphatase: 67 IU/L (ref 44–121)
BUN/Creatinine Ratio: 20 (ref 10–24)
BUN: 18 mg/dL (ref 8–27)
Bilirubin Total: 0.9 mg/dL (ref 0.0–1.2)
CO2: 23 mmol/L (ref 20–29)
Calcium: 9.6 mg/dL (ref 8.6–10.2)
Chloride: 106 mmol/L (ref 96–106)
Creatinine, Ser: 0.91 mg/dL (ref 0.76–1.27)
Globulin, Total: 2.2 g/dL (ref 1.5–4.5)
Glucose: 116 mg/dL — ABNORMAL HIGH (ref 70–99)
Potassium: 4.7 mmol/L (ref 3.5–5.2)
Sodium: 143 mmol/L (ref 134–144)
Total Protein: 6.8 g/dL (ref 6.0–8.5)
eGFR: 88 mL/min/{1.73_m2} (ref >59)

## 2021-06-19 LAB — VITAMIN D 25 HYDROXY (VIT D DEFICIENCY, FRACTURES): Vit D, 25-Hydroxy: 43.9 ng/mL (ref 30.0–100.0)

## 2021-06-19 LAB — TSH+FREE T4
Free T4: 0.95 ng/dL (ref 0.82–1.77)
TSH: 2.07 u[IU]/mL (ref 0.450–4.500)

## 2021-06-19 LAB — HEMOGLOBIN A1C
Est. average glucose Bld gHb Est-mCnc: 126 mg/dL
Hgb A1c MFr Bld: 6 % — ABNORMAL HIGH (ref 4.8–5.6)

## 2021-06-20 ENCOUNTER — Encounter: Payer: Self-pay | Admitting: Family Medicine

## 2021-08-17 NOTE — Progress Notes (Deleted)
      Established patient visit   Patient: Jeff Baker   DOB: [birth year]   75 y.o. Male  MRN: [id number] Visit Date: 08/20/2021  Today's healthcare provider: Gwyneth Sprout, FNP   No chief complaint on file.  Subjective    HPI  Anxiety, Follow-up  He was last seen for anxiety 8 months ago. Changes made at last visit include continue zoloft '100mg'$ .   He reports {excellent/good/fair/poor:19665} compliance with treatment. He reports {good/fair/poor:18685} tolerance of treatment. He {is/is not:21021397} having side effects. {document side effects if present:1}  He feels his anxiety is {Desc; severity:60313} and {improved/worse/unchanged:3041574} since last visit.  Symptoms: {Yes/No:20286} chest pain {Yes/No:20286} difficulty concentrating  {Yes/No:20286} dizziness {Yes/No:20286} fatigue  {Yes/No:20286} feelings of losing control {Yes/No:20286} insomnia  {Yes/No:20286} irritable {Yes/No:20286} palpitations  {Yes/No:20286} panic attacks {Yes/No:20286} racing thoughts  {Yes/No:20286} shortness of breath {Yes/No:20286} sweating  {Yes/No:20286} tremors/shakes    GAD-7 Results    12/21/2020    8:51 AM  GAD-7 Generalized Anxiety Disorder Screening Tool  1. Feeling Nervous, Anxious, or on Edge 2  2. Not Being Able to Stop or Control Worrying 1  3. Worrying Too Much About Different Things 1  4. Trouble Relaxing 0  5. Being So Restless it's Hard To Sit Still 0  6. Becoming Easily Annoyed or Irritable 1  7. Feeling Afraid As If Something Awful Might Happen 1  Total GAD-7 Score 6  Difficulty At Work, Home, or Getting  Along With Others? Not difficult at all    PHQ-9 Scores    06/18/2021    8:45 AM 12/21/2020    8:50 AM 10/14/2020    8:48 AM  PHQ9 SCORE ONLY  PHQ-9 Total Score '1 1 1    '$ ---------------------------------------------------------------------------------------------------   Medications: Outpatient Medications Prior to Visit  Medication Sig   ibuprofen  (ADVIL,MOTRIN) 600 MG tablet Take 1 tablet (600 mg total) by mouth every 8 (eight) hours as needed.   Multiple Vitamin (MULTIVITAMIN) capsule Take 1 capsule by mouth daily.    sertraline (ZOLOFT) 100 MG tablet Take 1 tablet (100 mg total) by mouth daily.   sertraline (ZOLOFT) 25 MG tablet Take 1 tablet (25 mg total) by mouth daily.   No facility-administered medications prior to visit.    Review of Systems  {Labs  Heme  Chem  Endocrine  Serology  Results Review (optional):23779}   Objective    There were no vitals taken for this visit. {Show previous vital signs (optional):23777}  Physical Exam  ***  No results found for any visits on 08/20/21.  Assessment & Plan     ***  No follow-ups on file.      {provider attestation***:1}   Gwyneth Sprout, [place] [phone] (phone) [phone] (fax)  [place]

## 2021-08-20 ENCOUNTER — Ambulatory Visit: Payer: Medicare PPO | Admitting: Family Medicine

## 2021-08-20 NOTE — Progress Notes (Deleted)
MyChart Video Visit    Virtual Visit via Video Note   This visit type was conducted due to national recommendations for restrictions regarding the COVID-19 Pandemic (e.g. social distancing) in an effort to limit this patient's exposure and mitigate transmission in our community. This patient is at least at moderate risk for complications without adequate follow up. This format is felt to be most appropriate for this patient at this time. Physical exam was limited by quality of the video and audio technology used for the visit.   Patient location: *** Provider location: ***  I discussed the limitations of evaluation and management by telemedicine and the availability of in person appointments. The patient expressed understanding and agreed to proceed.  Patient: Jeff Baker   DOB: [birth year]   75 y.o. Male  MRN: [id number] Visit Date: 08/25/2021  Today's healthcare provider: Gwyneth Sprout, FNP   No chief complaint on file.  Subjective    HPI  Anxiety, Follow-up  He was last seen for anxiety 9 months ago. Changes made at last visit include continue zoloft at '100mg'$ .   He reports {excellent/good/fair/poor:19665} compliance with treatment. He reports {good/fair/poor:18685} tolerance of treatment. He {is/is not:21021397} having side effects. {document side effects if present:1}  He feels his anxiety is {Desc; severity:60313} and {improved/worse/unchanged:3041574} since last visit.  Symptoms: {Yes/No:20286} chest pain {Yes/No:20286} difficulty concentrating  {Yes/No:20286} dizziness {Yes/No:20286} fatigue  {Yes/No:20286} feelings of losing control {Yes/No:20286} insomnia  {Yes/No:20286} irritable {Yes/No:20286} palpitations  {Yes/No:20286} panic attacks {Yes/No:20286} racing thoughts  {Yes/No:20286} shortness of breath {Yes/No:20286} sweating  {Yes/No:20286} tremors/shakes    GAD-7 Results    12/21/2020    8:51 AM  GAD-7 Generalized Anxiety Disorder Screening Tool  1.  Feeling Nervous, Anxious, or on Edge 2  2. Not Being Able to Stop or Control Worrying 1  3. Worrying Too Much About Different Things 1  4. Trouble Relaxing 0  5. Being So Restless it's Hard To Sit Still 0  6. Becoming Easily Annoyed or Irritable 1  7. Feeling Afraid As If Something Awful Might Happen 1  Total GAD-7 Score 6  Difficulty At Work, Home, or Getting  Along With Others? Not difficult at all    PHQ-9 Scores    06/18/2021    8:45 AM 12/21/2020    8:50 AM 10/14/2020    8:48 AM  PHQ9 SCORE ONLY  PHQ-9 Total Score '1 1 1    '$ ---------------------------------------------------------------------------------------------------    Medications: Outpatient Medications Prior to Visit  Medication Sig   ibuprofen (ADVIL,MOTRIN) 600 MG tablet Take 1 tablet (600 mg total) by mouth every 8 (eight) hours as needed.   Multiple Vitamin (MULTIVITAMIN) capsule Take 1 capsule by mouth daily.    sertraline (ZOLOFT) 100 MG tablet Take 1 tablet (100 mg total) by mouth daily.   sertraline (ZOLOFT) 25 MG tablet Take 1 tablet (25 mg total) by mouth daily.   No facility-administered medications prior to visit.    Review of Systems  {Labs  Heme  Chem  Endocrine  Serology  Results Review (optional):23779}   Objective    There were no vitals taken for this visit.  {Show previous vital signs (optional):23777}   Physical Exam     Assessment & Plan     ***  No follow-ups on file.     I discussed the assessment and treatment plan with the patient. The patient was provided an opportunity to ask questions and all were answered. The patient agreed with the plan and demonstrated an  understanding of the instructions.   The patient was advised to call back or seek an in-person evaluation if the symptoms worsen or if the condition fails to improve as anticipated.  I provided *** minutes of non-face-to-face time during this encounter.  {provider attestation***:1}  Gwyneth Sprout,  [place] [phone] (phone) [phone] (fax)  [place]

## 2021-08-25 ENCOUNTER — Telehealth: Payer: Medicare PPO | Admitting: Family Medicine

## 2021-08-30 NOTE — Progress Notes (Unsigned)
Established patient visit   Patient: Jeff Baker   DOB: March 07, 1945   75 y.o. Male  MRN: [id number] Visit Date: 08/31/2021  Today's healthcare provider: Gwyneth Sprout, FNP  Introduced to nurse practitioner role and practice setting.  All questions answered.  Discussed provider/patient relationship and expectations.  I,Nura Cahoon J Yani Coventry,acting as a scribe for Gwyneth Sprout, FNP.,have documented all relevant documentation on the behalf of Gwyneth Sprout, FNP,as directed by  Gwyneth Sprout, FNP while in the presence of Gwyneth Sprout, FNP.   Chief Complaint  Patient presents with   Anxiety   Subjective    HPI  Anxiety, Follow-up  He was last seen for anxiety 9 months ago. Changes made at last visit include continue zoloft '100mg'$ .   He reports excellent compliance with treatment. He reports excellent tolerance of treatment. He is not having side effects.   He feels his anxiety is mild and Unchanged since last visit.  Symptoms: No chest pain No difficulty concentrating  No dizziness Yes fatigue  No feelings of losing control No insomnia  Yes irritable No palpitations  No panic attacks Yes racing thoughts  No shortness of breath No sweating  No tremors/shakes    GAD-7 Results    08/31/2021    3:44 PM 12/21/2020    8:51 AM  GAD-7 Generalized Anxiety Disorder Screening Tool  1. Feeling Nervous, Anxious, or on Edge 1 2  2. Not Being Able to Stop or Control Worrying 1 1  3. Worrying Too Much About Different Things 1 1  4. Trouble Relaxing 0 0  5. Being So Restless it's Hard To Sit Still 0 0  6. Becoming Easily Annoyed or Irritable 1 1  7. Feeling Afraid As If Something Awful Might Happen 0 1  Total GAD-7 Score 4 6  Difficulty At Work, Home, or Getting  Along With Others? Not difficult at all Not difficult at all    PHQ-9 Scores    08/31/2021    3:44 PM 06/18/2021    8:45 AM 12/21/2020    8:50 AM  PHQ9 SCORE ONLY  PHQ-9 Total Score 0 1 1     ---------------------------------------------------------------------------------------------------   Medications: Outpatient Medications Prior to Visit  Medication Sig   ibuprofen (ADVIL,MOTRIN) 600 MG tablet Take 1 tablet (600 mg total) by mouth every 8 (eight) hours as needed.   Multiple Vitamin (MULTIVITAMIN) capsule Take 1 capsule by mouth daily.    [DISCONTINUED] sertraline (ZOLOFT) 100 MG tablet Take 1 tablet (100 mg total) by mouth daily.   [DISCONTINUED] sertraline (ZOLOFT) 25 MG tablet Take 1 tablet (25 mg total) by mouth daily.   No facility-administered medications prior to visit.    Review of Systems     Objective    BP 133/73 (BP Location: Right Arm, Patient Position: Sitting, Cuff Size: Normal)   Pulse (!) 55   Temp 97.8 F (36.6 C) (Oral)   Resp 16   Ht '6\' 1"'$  (1.854 m)   Wt 255 lb 4.8 oz (115.8 kg)   SpO2 99%   BMI 33.68 kg/m    Physical Exam Vitals and nursing note reviewed.  Constitutional:      Appearance: Normal appearance. He is obese.  HENT:     Head: Normocephalic and atraumatic.  Eyes:     Pupils: Pupils are equal, round, and reactive to light.  Cardiovascular:     Rate and Rhythm: Normal rate and regular rhythm.     Pulses: Normal pulses.  Heart sounds: Normal heart sounds.  Pulmonary:     Effort: Pulmonary effort is normal.     Breath sounds: Normal breath sounds.  Musculoskeletal:        General: Normal range of motion.     Cervical back: Normal range of motion.  Skin:    General: Skin is warm and dry.     Capillary Refill: Capillary refill takes less than 2 seconds.  Neurological:     General: No focal deficit present.     Mental Status: He is alert and oriented to person, place, and time. Mental status is at baseline.  Psychiatric:        Attention and Perception: Attention and perception normal.        Mood and Affect: Mood normal. Affect is flat.        Speech: Speech normal.        Behavior: Behavior normal.         Thought Content: Thought content normal.        Cognition and Memory: Cognition normal.       Results for orders placed or performed in visit on 08/31/21  POCT glycosylated hemoglobin (Hb A1C)  Result Value Ref Range   Hemoglobin A1C 6.1 (A) 4.0 - 5.6 %   Est. average glucose Bld gHb Est-mCnc 128   POCT Glucose (CBG)  Result Value Ref Range   POC Glucose 98 70 - 99 mg/dl    Assessment & Plan     Problem List Items Addressed This Visit       Other   Anxiety, generalized    Chronic, improved Has been doing 125-150 mg daily; will write for 150 mg I've explained to him that drugs of the SSRI class can have side effects such as weight gain, sexual dysfunction, insomnia, headache, nausea. These medications are generally effective at alleviating symptoms of anxiety and/or depression. Let me know if significant side effects do occur. 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 Smaller meal size, adding snacks. Choosing water as drink of choice and increasing purposeful exercise. Controlled with diet and exercise      Relevant Orders   POCT glycosylated hemoglobin (Hb A1C) (Completed)   POCT Glucose (CBG) (Completed)     No follow-ups on file.      Vonna Kotyk, FNP, have reviewed all documentation for this visit. The documentation on 08/31/21 for the exam, diagnosis, procedures, and orders are all accurate and complete.    Gwyneth Sprout, [place] [phone]  (phone) [phone] (fax)  [place]

## 2021-08-31 ENCOUNTER — Encounter: Payer: Self-pay | Admitting: Family Medicine

## 2021-08-31 ENCOUNTER — Ambulatory Visit: Payer: Medicare PPO | Admitting: Family Medicine

## 2021-08-31 VITALS — BP 133/73 | HR 55 | Temp 97.8°F | Resp 16 | Ht 73.0 in | Wt 255.3 lb

## 2021-08-31 DIAGNOSIS — R7303 Prediabetes: Secondary | ICD-10-CM

## 2021-08-31 DIAGNOSIS — F411 Generalized anxiety disorder: Secondary | ICD-10-CM

## 2021-08-31 DIAGNOSIS — F1021 Alcohol dependence, in remission: Secondary | ICD-10-CM | POA: Diagnosis not present

## 2021-08-31 LAB — GLUCOSE, POCT (MANUAL RESULT ENTRY): POC Glucose: 98 mg/dl (ref 70–99)

## 2021-08-31 LAB — POCT GLYCOSYLATED HEMOGLOBIN (HGB A1C)
Est. average glucose Bld gHb Est-mCnc: 128
Hemoglobin A1C: 6.1 % — AB (ref 4.0–5.6)

## 2021-08-31 MED ORDER — SERTRALINE HCL 50 MG PO TABS: 50.0000 mg | ORAL_TABLET | Freq: Every day | ORAL | 3 refills | Status: DC

## 2021-08-31 MED ORDER — SERTRALINE HCL 100 MG PO TABS: 100.0000 mg | ORAL_TABLET | Freq: Every day | ORAL | 3 refills | Status: DC

## 2021-08-31 NOTE — Assessment & Plan Note (Signed)
Chronic, improved Has been doing 125-150 mg daily; will write for 150 mg I've explained to him that drugs of the SSRI class can have side effects such as weight gain, sexual dysfunction, insomnia, headache, nausea. These medications are generally effective at alleviating symptoms of anxiety and/or depression. Let me know if significant side effects do occur. Discussed that it can take 6-8 weeks to reach full efficacy; patient declines follow up appt scheduling at this time Contracted for safety - no SI/HI Discussed synergistic effects of medications and therapy

## 2021-08-31 NOTE — Assessment & Plan Note (Signed)
Chronic, stable Previous A1c of 6% now 6.1% Continue to recommend balanced, lower carb meals. Smaller meal size, adding snacks. Choosing water as drink of choice and increasing purposeful exercise. Controlled with diet and exercise

## 2021-08-31 NOTE — Assessment & Plan Note (Signed)
39 years sober, also when patient became a vegetarian

## 2022-04-26 ENCOUNTER — Telehealth: Payer: Self-pay | Admitting: Family Medicine

## 2022-04-26 NOTE — Telephone Encounter (Signed)
Contacted CHASIN DONSBACH to schedule their annual wellness visit. Appointment made for 06/29/2022.  [place] Direct Dial: [phone]

## 2022-05-10 ENCOUNTER — Emergency Department: Payer: Medicare PPO

## 2022-05-10 ENCOUNTER — Other Ambulatory Visit: Payer: Self-pay

## 2022-05-10 ENCOUNTER — Emergency Department
Admission: EM | Admit: 2022-05-10 | Discharge: 2022-05-10 | Disposition: A | Discharge status: Home / Self Care | Payer: Medicare PPO | Attending: Emergency Medicine | Admitting: Emergency Medicine

## 2022-05-10 DIAGNOSIS — S29012A Strain of muscle and tendon of back wall of thorax, initial encounter: Secondary | ICD-10-CM | POA: Insufficient documentation

## 2022-05-10 DIAGNOSIS — N200 Calculus of kidney: Secondary | ICD-10-CM | POA: Insufficient documentation

## 2022-05-10 DIAGNOSIS — Y92410 Unspecified street and highway as the place of occurrence of the external cause: Secondary | ICD-10-CM | POA: Diagnosis not present

## 2022-05-10 DIAGNOSIS — M25512 Pain in left shoulder: Secondary | ICD-10-CM | POA: Diagnosis not present

## 2022-05-10 DIAGNOSIS — M546 Pain in thoracic spine: Secondary | ICD-10-CM | POA: Diagnosis present

## 2022-05-10 DIAGNOSIS — I700 Atherosclerosis of aorta: Secondary | ICD-10-CM | POA: Diagnosis not present

## 2022-05-10 DIAGNOSIS — S29019A Strain of muscle and tendon of unspecified wall of thorax, initial encounter: Secondary | ICD-10-CM

## 2022-05-10 NOTE — ED Notes (Signed)
Pt leaving for CT.  
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
## 2022-06-29 ENCOUNTER — Ambulatory Visit (INDEPENDENT_AMBULATORY_CARE_PROVIDER_SITE_OTHER): Payer: Medicare PPO

## 2022-06-29 VITALS — BP 120/62 | Ht 73.0 in | Wt 255.3 lb

## 2022-06-29 DIAGNOSIS — Z0000 Encounter for general adult medical examination without abnormal findings: Secondary | ICD-10-CM | POA: Diagnosis not present

## 2022-06-29 NOTE — Progress Notes (Signed)
Subjective:   Jeff Baker is a 77 y.o. male who presents for Medicare Annual/Subsequent preventive examination.  Review of Systems         Objective:    Today's Vitals   06/29/22 0849  BP: 120/62  Weight: 255 lb 4.8 oz (115.8 kg)  Height: 6\' 1"  (1.854 m)   Body mass index is 33.68 kg/m.     06/29/2022    8:56 AM 05/10/2022    7:21 PM 08/18/2020    7:45 AM 01/23/2020    9:30 AM 08/17/2019    1:55 PM 06/11/2019   11:47 AM 10/19/2016    2:29 PM  Advanced Directives  Does Patient Have a Medical Advance Directive? Yes No No No No Yes No  Type of Dispensing optician of [place];Living will   Copy of Healthcare Power of Attorney in Chart?      No - copy requested   Would patient like information on creating a medical advance directive?   No - Patient declined No - Patient declined No - Patient declined      Current Medications (verified) Outpatient Encounter Medications as of 06/29/2022  Medication Sig   ibuprofen (ADVIL,MOTRIN) 600 MG tablet Take 1 tablet (600 mg total) by mouth every 8 (eight) hours as needed.   Multiple Vitamin (MULTIVITAMIN) capsule Take 1 capsule by mouth daily.    sertraline (ZOLOFT) 100 MG tablet Take 1 tablet (100 mg total) by mouth daily.   sertraline (ZOLOFT) 50 MG tablet Take 1 tablet (50 mg total) by mouth daily. Take with 100 mg for total 150 mg   No facility-administered encounter medications on file as of 06/29/2022.    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   Past Surgical History:  Procedure Laterality Date   CESAREAN SECTION  2011   Just to see if you're actually paying attention   COLONOSCOPY WITH PROPOFOL N/A 04/28/2015   Procedure: COLONOSCOPY WITH PROPOFOL;  Surgeon: Midge Minium, MD;  Location: [facility] ENDOSCOPY;  Service: Endoscopy;  Laterality: N/A;   COLONOSCOPY WITH PROPOFOL N/A 08/18/2020   Procedure: COLONOSCOPY WITH PROPOFOL;  Surgeon: Midge Minium, MD;  Location: [facility] ENDOSCOPY;  Service: Endoscopy;  Laterality: N/A;   EXTRACORPOREAL SHOCK WAVE LITHOTRIPSY Left 01/23/2020   Procedure: EXTRACORPOREAL SHOCK WAVE LITHOTRIPSY (ESWL);  Surgeon: Vanna Scotland, MD;  Location: [facility] ORS;  Service: Urology;  Laterality: Left;   EYE SURGERY  2022   NO PAST SURGERIES     Family History  Problem Relation Age of Onset   Dementia Mother    Anxiety disorder Mother    Alcohol abuse Father    Drug abuse Father    Arthritis Father    Early death Father    Pulmonary disease Brother    Kidney disease Son    Prostate cancer Neg Hx    Bladder Cancer Neg Hx  Kidney cancer Neg Hx    Social History   Socioeconomic History   Marital status: Married    Spouse name: Not on file   Number of children: 2   Years of education: Not on file   Highest education level: Bachelor's degree (e.g., BA, AB, BS)  Occupational History   Occupation: Tax adviser    Comment: full time  Tobacco Use   Smoking status: Former    Types: Cigars   Smokeless tobacco: Never   Tobacco comments:    It was a brief, lovely, flirtation  Vaping Use   Vaping Use: Never used  Substance and Sexual Activity   Alcohol use: No    Comment: had a history of alcohol abuse been sober >31 years   Drug use: No   Sexual activity: Yes    Comment: Old age is a pretty effective form of birth control  Other Topics Concern   Not on file  Social History Narrative   Not on file   Social Determinants of Health   Financial Resource Strain:  Low Risk  (06/28/2022)   Overall Financial Resource Strain (CARDIA)    Difficulty of Paying Living Expenses: Not hard at all  Food Insecurity: No Food Insecurity (06/28/2022)   Hunger Vital Sign    Worried About Running Out of Food in the Last Year: Never true    Ran Out of Food in the Last Year: Never true  Transportation Needs: No Transportation Needs (06/28/2022)   PRAPARE - Administrator, Civil Service (Medical): No    Lack of Transportation (Non-Medical): No  Physical Activity: Insufficiently Active (06/28/2022)   Exercise Vital Sign    Days of Exercise per Week: 2 days    Minutes of Exercise per Session: 40 min  Stress: Stress Concern Present (06/28/2022)   [employer] of Occupational Health - Occupational Stress Questionnaire    Feeling of Stress : To some extent  Social Connections: Moderately Isolated (06/28/2022)   Social Connection and Isolation Panel [NHANES]    Frequency of Communication with Friends and Family: More than three times a week    Frequency of Social Gatherings with Friends and Family: More than three times a week    Attends Religious Services: Never    Database administrator or Organizations: No    Attends Banker Meetings: Never    Marital Status: Married    Tobacco Counseling Counseling given: Not Answered Tobacco comments: It was a brief, lovely, flirtation   Clinical Intake:  Pre-visit preparation completed: Yes  Pain : No/denies pain   BMI - recorded: 33.68 Nutritional Status: BMI > 30  Obese Nutritional Risks: None Diabetes: No  How often do you need to have someone help you when you read instructions, pamphlets, or other written materials from your doctor or pharmacy?: 1 - Never  Diabetic?no  Interpreter Needed?: No  Comments: lives wife Information entered by :: B.Danford Tat,LPN   Activities of Daily Living    06/28/2022    9:40 AM 08/31/2021    3:44 PM  In your present state of health, do you have any  difficulty performing the following activities:  Hearing? 1 0  Vision? 0 0  Difficulty concentrating or making decisions? 0 0  Walking or climbing stairs? 0 0  Dressing or bathing? 0 0  Doing errands, shopping? 0 0  Preparing Food and eating ? N   Using the Toilet? N   In the past six months, have you accidently leaked urine?  N   Do you have problems with loss of bowel control? N   Managing your Medications? N   Managing your Finances? N   Housekeeping or managing your Housekeeping? N     Patient Care Team: Jacky Kindle, FNP as PCP - General (Family Medicine) [facility], [facility])  Indicate any recent Medical Services you may have received from other than [facility] providers in the past year (date may be approximate).     Assessment:   This is a routine wellness examination for Jeff Baker.  Hearing/Vision screen Hearing Screening - Comments:: Adequate hearing with hearing aides Vision Screening - Comments:: Adequate vision with glasses [facility]   Dietary issues and exercise activities discussed:     Goals Addressed             This Visit's Progress    DIET - INCREASE WATER INTAKE   On track    Recommend to drink at least 6-8 8oz glasses of water per day.       Depression Screen    06/29/2022    9:13 AM 08/31/2021    3:44 PM 06/18/2021    8:45 AM 12/21/2020    8:50 AM 10/14/2020    8:48 AM 06/17/2020    1:16 PM 12/30/2019    3:23 PM  PHQ 2/9 Scores  PHQ - 2 Score 0 0 0 0 0 0 0  PHQ- 9 Score  0 1 1 1 1 1     Fall Risk    06/28/2022    9:40 AM 08/31/2021    3:43 PM 06/17/2021    2:22 PM 12/21/2020    8:50 AM 10/14/2020    8:48 AM  Fall Risk   Falls in the past year? 0 0 0 0 0  Number falls in past yr: 0 0 0 0 0  Injury with Fall? 0 0  0 0  Risk for fall due to :    No Fall Risks No Fall Risks  Follow up    Falls evaluation completed Falls evaluation completed    FALL RISK PREVENTION PERTAINING TO THE HOME:  Any stairs in or around the home?  Yes  3 steps If so, are there any without handrails? No  Home free of loose throw rugs in walkways, pet beds, electrical cords, etc? Yes  Adequate lighting in your home to reduce risk of falls? Yes   ASSISTIVE DEVICES UTILIZED TO PREVENT FALLS:  Life alert? No  Use of a cane, walker or w/c? No  Grab bars in the bathroom? Yes  Shower chair or bench in shower? Yes  Elevated toilet seat or a handicapped toilet? Yes   TIMED UP AND GO:  Was the test performed? Yes .  Length of time to ambulate 10 feet: 8 sec.   Gait steady and fast without use of assistive device  Cognitive Function:        06/29/2022    8:56 AM 06/11/2019   11:55 AM  6CIT Screen  What Year? 0 points 0 points  What month? 0 points 0 points  What time? 0 points 0 points  Count back from 20 0 points 0 points  Months in reverse 0 points 0 points  Repeat phrase 0 points 2 points  Total Score 0 points 2 points    Immunizations Immunization History  Administered Date(s) Administered   Fluad Quad(high Dose 65+) 12/05/2018, 12/30/2019, 12/21/2020   Influenza, High Dose Seasonal PF 12/24/2016, 12/08/2017   Influenza,inj,Quad PF,6+ Mos 03/09/2015  Influenza-Unspecified 12/07/2013   PFIZER(Purple Top)SARS-COV-2 Vaccination 04/06/2019, 04/27/2019, 11/18/2019   Pneumococcal Conjugate-13 02/26/2014   Pneumococcal Polysaccharide-23 04/01/2011   Tdap 12/01/2015   Zoster Recombinat (Shingrix) 03/20/2017, 04/16/2018    TDAP status: Up to date  Flu Vaccine status: Up to date  Pneumococcal vaccine status: Up to date  Covid-19 vaccine status: Completed vaccines  Qualifies for Shingles Vaccine? Yes   Zostavax completed Yes   Shingrix Completed?: Yes  Screening Tests Health Maintenance  Topic Date Due   COVID-19 Vaccine (4 - 2023-24 season) 10/22/2021   INFLUENZA VACCINE  09/22/2022   Medicare Annual Wellness (AWV)  06/29/2023   COLONOSCOPY (Pts 45-65yrs Insurance coverage will need to be confirmed)  08/18/2025    DTaP/Tdap/Td (2 - Td or Tdap) 11/30/2025   Pneumonia Vaccine 70+ Years old  Completed   Hepatitis C Screening  Completed   Zoster Vaccines- Shingrix  Completed   HPV VACCINES  Aged Out    Health Maintenance  Health Maintenance Due  Topic Date Due   COVID-19 Vaccine (4 - 2023-24 season) 10/22/2021    Colorectal cancer screening: No longer required.   Lung Cancer Screening: (Low Dose CT Chest recommended if Age 74-80 years, 30 pack-year currently smoking OR have quit w/in 15years.) does not qualify.   Lung Cancer Screening Referral: no  Additional Screening:  Hepatitis C Screening: does not qualify; Completed yes  Vision Screening: Recommended annual ophthalmology exams for early detection of glaucoma and other disorders of the eye. Is the patient up to date with their annual eye exam?  Yes  Who is the provider or what is the name of the office in which the patient attends annual eye exams? [facility] If pt is not established with a provider, would they like to be referred to a provider to establish care? No .   Dental Screening: Recommended annual dental exams for proper oral hygiene  Community Resource Referral / Chronic Care Management: CRR required this visit?  No   CCM required this visit?  No     Plan:     I have personally reviewed and noted the following in the patient's chart:   Medical and social history Use of alcohol, tobacco or illicit drugs  Current medications and supplements including opioid prescriptions. Patient is not currently taking opioid prescriptions. Functional ability and status Nutritional status Physical activity Advanced directives List of other physicians Hospitalizations, surgeries, and ER visits in previous 12 months Vitals Screenings to include cognitive, depression, and falls Referrals and appointments  In addition, I have reviewed and discussed with patient certain preventive protocols, quality metrics, and best practice  recommendations. A written personalized care plan for preventive services as well as general preventive health recommendations were provided to patient.     Sue Lush, LPN   05/26/4096   Nurse Notes: The patient states he is doing well and has no concerns or questions at this time. *Appt made for PE (pt was under impression this was his PE).

## 2022-06-29 NOTE — Patient Instructions (Addendum)
Jeff Baker , Thank you for taking time to come for your Medicare Wellness Visit. I appreciate your ongoing commitment to your health goals. Please review the following plan we discussed and let me know if I can assist you in the future.   These are the goals we discussed:  Goals      DIET - INCREASE WATER INTAKE     Recommend to drink at least 6-8 8oz glasses of water per day.        This is a list of the screening recommended for you and due dates:  Health Maintenance  Topic Date Due   COVID-19 Vaccine (4 - 2023-24 season) 10/22/2021   Flu Shot  09/22/2022   Medicare Annual Wellness Visit  06/29/2023   Colon Cancer Screening  08/18/2025   DTaP/Tdap/Td vaccine (2 - Td or Tdap) 11/30/2025   Pneumonia Vaccine  Completed   Hepatitis C Screening: USPSTF Recommendation to screen - Ages 28-79 yo.  Completed   Zoster (Shingles) Vaccine  Completed   HPV Vaccine  Aged Out    Advanced directives: yes  Conditions/risks identified: none  Next appointment: Follow up in one year for your annual wellness visit. 07/03/2023 @8 :45am telephone  Preventive Care 65 Years and Older, Male  Preventive care refers to lifestyle choices and visits with your health care provider that can promote health and wellness. What does preventive care include? A yearly physical exam. This is also called an annual well check. Dental exams once or twice a year. Routine eye exams. Ask your health care provider how often you should have your eyes checked. Personal lifestyle choices, including: Daily care of your teeth and gums. Regular physical activity. Eating a healthy diet. Avoiding tobacco and drug use. Limiting alcohol use. Practicing safe sex. Taking low doses of aspirin every day. Taking vitamin and mineral supplements as recommended by your health care provider. What happens during an annual well check? The services and screenings done by your health care provider during your annual well check will  depend on your age, overall health, lifestyle risk factors, and family history of disease. Counseling  Your health care provider may ask you questions about your: Alcohol use. Tobacco use. Drug use. Emotional well-being. Home and relationship well-being. Sexual activity. Eating habits. History of falls. Memory and ability to understand (cognition). Work and work Astronomer. Screening  You may have the following tests or measurements: Height, weight, and BMI. Blood pressure. Lipid and cholesterol levels. These may be checked every 5 years, or more frequently if you are over 32 years old. Skin check. Lung cancer screening. You may have this screening every year starting at age 62 if you have a 30-pack-year history of smoking and currently smoke or have quit within the past 15 years. Fecal occult blood test (FOBT) of the stool. You may have this test every year starting at age 20. Flexible sigmoidoscopy or colonoscopy. You may have a sigmoidoscopy every 5 years or a colonoscopy every 10 years starting at age 37. Prostate cancer screening. Recommendations will vary depending on your family history and other risks. Hepatitis C blood test. Hepatitis B blood test. Sexually transmitted disease (STD) testing. Diabetes screening. This is done by checking your blood sugar (glucose) after you have not eaten for a while (fasting). You may have this done every 1-3 years. Abdominal aortic aneurysm (AAA) screening. You may need this if you are a current or former smoker. Osteoporosis. You may be screened starting at age 41 if you are at  high risk. Talk with your health care provider about your test results, treatment options, and if necessary, the need for more tests. Vaccines  Your health care provider may recommend certain vaccines, such as: Influenza vaccine. This is recommended every year. Tetanus, diphtheria, and acellular pertussis (Tdap, Td) vaccine. You may need a Td booster every 10  years. Zoster vaccine. You may need this after age 85. Pneumococcal 13-valent conjugate (PCV13) vaccine. One dose is recommended after age 83. Pneumococcal polysaccharide (PPSV23) vaccine. One dose is recommended after age 43. Talk to your health care provider about which screenings and vaccines you need and how often you need them. This information is not intended to replace advice given to you by your health care provider. Make sure you discuss any questions you have with your health care provider. Document Released: 03/06/2015 Document Revised: 10/28/2015 Document Reviewed: 12/09/2014 Elsevier Interactive Patient Education  2017 [place] Prevention in the Home Falls can cause injuries. They can happen to people of all ages. There are many things you can do to make your home safe and to help prevent falls. What can I do on the outside of my home? Regularly fix the edges of walkways and driveways and fix any cracks. Remove anything that might make you trip as you walk through a door, such as a raised step or threshold. Trim any bushes or trees on the path to your home. Use bright outdoor lighting. Clear any walking paths of anything that might make someone trip, such as rocks or tools. Regularly check to see if handrails are loose or broken. Make sure that both sides of any steps have handrails. Any raised decks and porches should have guardrails on the edges. Have any leaves, snow, or ice cleared regularly. Use sand or salt on walking paths during winter. Clean up any spills in your garage right away. This includes oil or grease spills. What can I do in the bathroom? Use night lights. Install grab bars by the toilet and in the tub and shower. Do not use towel bars as grab bars. Use non-skid mats or decals in the tub or shower. If you need to sit down in the shower, use a plastic, non-slip stool. Keep the floor dry. Clean up any water that spills on the floor as soon as it  happens. Remove soap buildup in the tub or shower regularly. Attach bath mats securely with double-sided non-slip rug tape. Do not have throw rugs and other things on the floor that can make you trip. What can I do in the bedroom? Use night lights. Make sure that you have a light by your bed that is easy to reach. Do not use any sheets or blankets that are too big for your bed. They should not hang down onto the floor. Have a firm chair that has side arms. You can use this for support while you get dressed. Do not have throw rugs and other things on the floor that can make you trip. What can I do in the kitchen? Clean up any spills right away. Avoid walking on wet floors. Keep items that you use a lot in easy-to-reach places. If you need to reach something above you, use a strong step stool that has a grab bar. Keep electrical cords out of the way. Do not use floor polish or wax that makes floors slippery. If you must use wax, use non-skid floor wax. Do not have throw rugs and other things on the floor that  can make you trip. What can I do with my stairs? Do not leave any items on the stairs. Make sure that there are handrails on both sides of the stairs and use them. Fix handrails that are broken or loose. Make sure that handrails are as long as the stairways. Check any carpeting to make sure that it is firmly attached to the stairs. Fix any carpet that is loose or worn. Avoid having throw rugs at the top or bottom of the stairs. If you do have throw rugs, attach them to the floor with carpet tape. Make sure that you have a light switch at the top of the stairs and the bottom of the stairs. If you do not have them, ask someone to add them for you. What else can I do to help prevent falls? Wear shoes that: Do not have high heels. Have rubber bottoms. Are comfortable and fit you well. Are closed at the toe. Do not wear sandals. If you use a stepladder: Make sure that it is fully opened.  Do not climb a closed stepladder. Make sure that both sides of the stepladder are locked into place. Ask someone to hold it for you, if possible. Clearly mark and make sure that you can see: Any grab bars or handrails. First and last steps. Where the edge of each step is. Use tools that help you move around (mobility aids) if they are needed. These include: Canes. Walkers. Scooters. Crutches. Turn on the lights when you go into a dark area. Replace any light bulbs as soon as they burn out. Set up your furniture so you have a clear path. Avoid moving your furniture around. If any of your floors are uneven, fix them. If there are any pets around you, be aware of where they are. Review your medicines with your doctor. Some medicines can make you feel dizzy. This can increase your chance of falling. Ask your doctor what other things that you can do to help prevent falls. This information is not intended to replace advice given to you by your health care provider. Make sure you discuss any questions you have with your health care provider. Document Released: 12/04/2008 Document Revised: 07/16/2015 Document Reviewed: 03/14/2014 Elsevier Interactive Patient Education  2017 [employer].

## 2022-08-08 ENCOUNTER — Encounter: Payer: Self-pay | Admitting: Family Medicine

## 2022-08-11 ENCOUNTER — Telehealth: Payer: Self-pay

## 2022-08-11 ENCOUNTER — Other Ambulatory Visit: Payer: Self-pay | Admitting: Family Medicine

## 2022-08-11 DIAGNOSIS — R399 Unspecified symptoms and signs involving the genitourinary system: Secondary | ICD-10-CM

## 2022-08-11 DIAGNOSIS — E559 Vitamin D deficiency, unspecified: Secondary | ICD-10-CM

## 2022-08-11 DIAGNOSIS — R002 Palpitations: Secondary | ICD-10-CM

## 2022-08-11 DIAGNOSIS — R7303 Prediabetes: Secondary | ICD-10-CM

## 2022-08-11 DIAGNOSIS — I10 Essential (primary) hypertension: Secondary | ICD-10-CM

## 2022-08-11 DIAGNOSIS — E782 Mixed hyperlipidemia: Secondary | ICD-10-CM

## 2022-08-11 NOTE — Telephone Encounter (Signed)
Jeff Baker This patient called asking to have abs done before his appointment because he couldn't fast all day.  I forwarded the message.  He just called again and spoke to the [facility] agent.  I told the [facility] agent that you had the message but no labs had yet been ordered.  I asked the agent to let him know that if the labs didn't get ordered before his appointment he did have to fast that day and could have them done after.  The response to the agent was that he was having a physical and he should have labs done and it was negligent to not have labs.   I wanted you to know that the patient is either upset or confused about what was meant so please let me know when you want the labs done and I will explain it to the patient.  He wanted to come today his appointment is tomorrow. Thanks

## 2022-08-12 ENCOUNTER — Ambulatory Visit (INDEPENDENT_AMBULATORY_CARE_PROVIDER_SITE_OTHER): Payer: Medicare PPO | Admitting: Family Medicine

## 2022-08-12 ENCOUNTER — Encounter: Payer: Self-pay | Admitting: Family Medicine

## 2022-08-12 VITALS — BP 133/68 | HR 61 | Ht 73.0 in | Wt 247.0 lb

## 2022-08-12 DIAGNOSIS — Z0000 Encounter for general adult medical examination without abnormal findings: Secondary | ICD-10-CM | POA: Diagnosis not present

## 2022-08-12 DIAGNOSIS — I10 Essential (primary) hypertension: Secondary | ICD-10-CM | POA: Diagnosis not present

## 2022-08-12 DIAGNOSIS — F411 Generalized anxiety disorder: Secondary | ICD-10-CM | POA: Diagnosis not present

## 2022-08-12 DIAGNOSIS — R399 Unspecified symptoms and signs involving the genitourinary system: Secondary | ICD-10-CM

## 2022-08-12 DIAGNOSIS — E559 Vitamin D deficiency, unspecified: Secondary | ICD-10-CM

## 2022-08-12 DIAGNOSIS — F1021 Alcohol dependence, in remission: Secondary | ICD-10-CM

## 2022-08-12 DIAGNOSIS — R7303 Prediabetes: Secondary | ICD-10-CM

## 2022-08-12 DIAGNOSIS — E782 Mixed hyperlipidemia: Secondary | ICD-10-CM

## 2022-08-12 HISTORY — DX: Essential (primary) hypertension: I10

## 2022-08-12 MED ORDER — SERTRALINE HCL 50 MG PO TABS: 50.0000 mg | ORAL_TABLET | Freq: Every day | ORAL | 3 refills | Status: DC

## 2022-08-12 MED ORDER — SERTRALINE HCL 100 MG PO TABS: 100.0000 mg | ORAL_TABLET | Freq: Every day | ORAL | 3 refills | Status: DC

## 2022-08-12 NOTE — Assessment & Plan Note (Signed)
Chronic, stable with diet/exercise Goal <139/<79

## 2022-08-12 NOTE — Assessment & Plan Note (Signed)
Chronic, stable Labs pending Goal to prevent T2DM Continue to recommend balanced, lower carb meals. Smaller meal size, adding snacks. Choosing water as drink of choice and increasing purposeful exercise.

## 2022-08-12 NOTE — Progress Notes (Signed)
I,Sha'taria Tyson,acting as a Neurosurgeon for Jacky Kindle, FNP.,have documented all relevant documentation on the behalf of Jacky Kindle, FNP,as directed by  Jacky Kindle, FNP while in the presence of Jacky Kindle, FNP.   Complete physical exam   Patient: Jeff Baker   DOB: [birth year]   77 y.o. Male  MRN: [id number] Visit Date: 08/12/2022  Today's healthcare provider: Jacky Kindle, FNP   Labs completed fasting 08/12/22  Subjective    Jeff Baker is a 77 y.o. male who presents today for a complete physical exam.  He reports consuming a  vegetarian  diet. Home exercise routine includes walking 2 hrs per week. He generally feels well. He reports sleeping well. He does have additional problems to discuss today.   HPI  Jeff Baker notes work is unrelenting as he has 2 months of vacation which he can't seem to use. May try to go to [place] to fish with his brother. Also notes one of his sons is getting married in [place] in October 2024.  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   Past Surgical History:  Procedure Laterality Date   CESAREAN SECTION  2011   Just to see if you're actually paying attention   COLONOSCOPY WITH PROPOFOL N/A 04/28/2015   Procedure: COLONOSCOPY WITH PROPOFOL;  Surgeon: Midge Minium, MD;  Location: [facility] ENDOSCOPY;  Service: Endoscopy;  Laterality: N/A;   COLONOSCOPY WITH PROPOFOL N/A 08/18/2020   Procedure: COLONOSCOPY WITH PROPOFOL;  Surgeon: Midge Minium, MD;  Location: [facility]OSCOPY;  Service:  Endoscopy;  Laterality: N/A;   EXTRACORPOREAL SHOCK WAVE LITHOTRIPSY Left 01/23/2020   Procedure: EXTRACORPOREAL SHOCK WAVE LITHOTRIPSY (ESWL);  Surgeon: Vanna Scotland, MD;  Location: [facility] ORS;  Service: Urology;  Laterality: Left;   EYE SURGERY  2022   NO PAST SURGERIES     Social History   Socioeconomic History   Marital status: Married    Spouse name: Not on file   Number of children: 2   Years of education: Not on file   Highest education level: Bachelor's degree (e.g., BA, AB, BS)  Occupational History   Occupation: Tax adviser    Comment: full time  Tobacco Use   Smoking status: Former    Types: Cigars   Smokeless tobacco: Never   Tobacco comments:    It was a brief, lovely, flirtation  Vaping Use   Vaping Use: Never used  Substance and Sexual Activity   Alcohol use: No    Comment: had a history of alcohol abuse been sober >31 years   Drug use: No   Sexual activity: Yes    Comment: Old age is a pretty effective form of birth control  Other Topics Concern   Not on file  Social History Narrative   Not on file   Social Determinants of Health   Financial Resource Strain: Low Risk  (06/28/2022)   Overall  Financial Resource Strain (CARDIA)    Difficulty of Paying Living Expenses: Not hard at all  Food Insecurity: No Food Insecurity (06/28/2022)   Hunger Vital Sign    Worried About Running Out of Food in the Last Year: Never true    Ran Out of Food in the Last Year: Never true  Transportation Needs: No Transportation Needs (06/28/2022)   PRAPARE - Administrator, Civil Service (Medical): No    Lack of Transportation (Non-Medical): No  Physical Activity: Insufficiently Active (06/28/2022)   Exercise Vital Sign    Days of Exercise per Week: 2 days    Minutes of Exercise per Session: 40 min  Stress: Stress Concern Present (06/28/2022)   [employer] of Occupational Health - Occupational Stress Questionnaire    Feeling of Stress : To some extent   Social Connections: Moderately Isolated (06/28/2022)   Social Connection and Isolation Panel [NHANES]    Frequency of Communication with Friends and Family: More than three times a week    Frequency of Social Gatherings with Friends and Family: More than three times a week    Attends Religious Services: Never    Database administrator or Organizations: No    Attends Banker Meetings: Never    Marital Status: Married  Catering manager Violence: Not At Risk (06/29/2022)   Humiliation, Afraid, Rape, and Kick questionnaire    Fear of Current or Ex-Partner: No    Emotionally Abused: No    Physically Abused: No    Sexually Abused: No   Family Status  Relation Name Status   Mother Jeff Baker (how is her name relevant to you???) Deceased at age 59   Father Jeff Baker Deceased at age 70       brain abcess   Sister  Alive   Brother  Alive   Brother  Alive   Son See above, Therapist, sports, comment (Not Specified)   Neg Hx  (Not Specified)   Family History  Problem Relation Age of Onset   Dementia Mother    Anxiety disorder Mother    Alcohol abuse Father    Drug abuse Father    Arthritis Father    Early death Father    Pulmonary disease Brother    Kidney disease Son    Prostate cancer Neg Hx    Bladder Cancer Neg Hx    Kidney cancer Neg Hx    Allergies  Allergen Reactions   Bactrim [Sulfamethoxazole-Trimethoprim]     Reports mild skin rash, stomach cramps and diarrhea.    Patient Care Team: Jacky Kindle, FNP as PCP - General (Family Medicine) [facility], [facility])   Medications: Outpatient Medications Prior to Visit  Medication Sig   [DISCONTINUED] ibuprofen (ADVIL,MOTRIN) 600 MG tablet Take 1 tablet (600 mg total) by mouth every 8 (eight) hours as needed.   [DISCONTINUED] Multiple Vitamin (MULTIVITAMIN) capsule Take 1 capsule by mouth daily.    [DISCONTINUED] sertraline (ZOLOFT) 100 MG tablet Take 1 tablet (100 mg total) by mouth daily.   [DISCONTINUED] sertraline  (ZOLOFT) 50 MG tablet Take 1 tablet (50 mg total) by mouth daily. Take with 100 mg for total 150 mg   No facility-administered medications prior to visit.    Review of Systems  HENT:  Positive for postnasal drip, sinus pressure and tinnitus.   Genitourinary:  Positive for urgency.  Musculoskeletal:  Positive for myalgias, neck pain and neck stiffness.  Psychiatric/Behavioral:  The patient is nervous/anxious.  Objective    BP 133/68   Pulse 61   Ht 6\' 1"  (1.854 m)   Wt 247 lb (112 kg)   SpO2 98%   BMI 32.59 kg/m     Physical Exam Vitals and nursing note reviewed.  Constitutional:      General: He is awake. He is not in acute distress.    Appearance: Normal appearance. He is well-developed and well-groomed. He is obese. He is not ill-appearing, toxic-appearing or diaphoretic.  HENT:     Head: Normocephalic and atraumatic.     Jaw: There is normal jaw occlusion. No trismus, tenderness, swelling or pain on movement.     Salivary Glands: Right salivary gland is not diffusely enlarged or tender. Left salivary gland is not diffusely enlarged or tender.     Right Ear: Tympanic membrane, ear canal and external ear normal. Decreased hearing noted. There is no impacted cerumen.     Left Ear: Tympanic membrane, ear canal and external ear normal. Decreased hearing noted. There is no impacted cerumen.     Ears:     Comments: Hearing aides bilaterally     Nose: Nose normal. No congestion or rhinorrhea.     Right Turbinates: Not enlarged, swollen or pale.     Left Turbinates: Not enlarged, swollen or pale.     Right Sinus: No maxillary sinus tenderness or frontal sinus tenderness.     Left Sinus: No maxillary sinus tenderness or frontal sinus tenderness.     Mouth/Throat:     Lips: Pink.     Mouth: Mucous membranes are moist. No injury, lacerations, oral lesions or angioedema.     Pharynx: Oropharynx is clear. Uvula midline. No pharyngeal swelling, oropharyngeal exudate or  posterior oropharyngeal erythema.     Tonsils: No tonsillar exudate or tonsillar abscesses.     Comments: Partial dentures  Eyes:     General: Lids are normal. Vision grossly intact. Gaze aligned appropriately.        Right eye: No discharge.        Left eye: No discharge.     Extraocular Movements: Extraocular movements intact.     Conjunctiva/sclera: Conjunctivae normal.     Pupils: Pupils are equal, round, and reactive to light.     Comments: Bi focal glasses  Neck:     Thyroid: No thyroid mass, thyromegaly or thyroid tenderness.     Vascular: No carotid bruit.     Trachea: Trachea normal. No tracheal tenderness.  Cardiovascular:     Rate and Rhythm: Normal rate and regular rhythm.     Pulses: Normal pulses.          Carotid pulses are 2+ on the right side and 2+ on the left side.      Radial pulses are 2+ on the right side and 2+ on the left side.       Femoral pulses are 2+ on the right side and 2+ on the left side.      Popliteal pulses are 2+ on the right side and 2+ on the left side.       Dorsalis pedis pulses are 2+ on the right side and 2+ on the left side.       Posterior tibial pulses are 2+ on the right side and 2+ on the left side.     Heart sounds: Normal heart sounds, S1 normal and S2 normal. No murmur heard.    No friction rub. No gallop.  Pulmonary:     Effort:  Pulmonary effort is normal. No respiratory distress.     Breath sounds: Normal breath sounds and air entry. No stridor. No wheezing, rhonchi or rales.  Chest:     Chest wall: No tenderness.  Abdominal:     General: Abdomen is flat. Bowel sounds are normal. There is no distension.     Palpations: Abdomen is soft. There is no mass.     Tenderness: There is no abdominal tenderness. There is no guarding or rebound.     Hernia: No hernia is present.  Genitourinary:    Comments: Exam deferred; denies complaints Musculoskeletal:        General: No swelling, tenderness, deformity or signs of injury. Normal  range of motion.     Cervical back: Normal range of motion and neck supple. No rigidity or tenderness.     Right lower leg: No edema.     Left lower leg: No edema.  Lymphadenopathy:     Cervical: No cervical adenopathy.     Right cervical: No superficial, deep or posterior cervical adenopathy.    Left cervical: No superficial, deep or posterior cervical adenopathy.  Skin:    General: Skin is warm and dry.     Capillary Refill: Capillary refill takes less than 2 seconds.     Coloration: Skin is not jaundiced or pale.     Findings: No bruising, erythema, lesion or rash.  Neurological:     General: No focal deficit present.     Mental Status: He is alert and oriented to person, place, and time. Mental status is at baseline.     GCS: GCS eye subscore is 4. GCS verbal subscore is 5. GCS motor subscore is 6.     Sensory: Sensation is intact. No sensory deficit.     Motor: Motor function is intact. No weakness.     Coordination: Coordination is intact.     Gait: Gait is intact.  Psychiatric:        Attention and Perception: Attention and perception normal.        Mood and Affect: Mood and affect normal.        Speech: Speech normal.        Behavior: Behavior normal. Behavior is cooperative.        Thought Content: Thought content normal.        Cognition and Memory: Cognition normal.        Judgment: Judgment normal.     Last depression screening scores    06/29/2022    9:13 AM 08/31/2021    3:44 PM 06/18/2021    8:45 AM  PHQ 2/9 Scores  PHQ - 2 Score 0 0 0  PHQ- 9 Score  0 1   Last fall risk screening    06/28/2022    9:40 AM  Fall Risk   Falls in the past year? 0  Number falls in past yr: 0  Injury with Fall? 0   Last Audit-C alcohol use screening    06/28/2022    9:40 AM  Alcohol Use Disorder Test (AUDIT)  1. How often do you have a drink containing alcohol? 0  3. How often do you have six or more drinks on one occasion? 0   A score of 3 or more in women, and 4 or more  in men indicates increased risk for alcohol abuse, EXCEPT if all of the points are from question 1   No results found for any visits on 08/12/22.  Assessment & Plan  Routine Health Maintenance and Physical Exam  Exercise Activities and Dietary recommendations  Goals      DIET - INCREASE WATER INTAKE     Recommend to drink at least 6-8 8oz glasses of water per day.        Immunization History  Administered Date(s) Administered   Fluad Quad(high Dose 65+) 12/05/2018, 12/30/2019, 12/21/2020   Influenza, High Dose Seasonal PF 12/24/2016, 12/08/2017   Influenza,inj,Quad PF,6+ Mos 03/09/2015   Influenza-Unspecified 12/07/2013   PFIZER(Purple Top)SARS-COV-2 Vaccination 04/06/2019, 04/27/2019, 11/18/2019   Pneumococcal Conjugate-13 02/26/2014   Pneumococcal Polysaccharide-23 04/01/2011   Tdap 12/01/2015   Zoster Recombinat (Shingrix) 03/20/2017, 04/16/2018    Health Maintenance  Topic Date Due   COVID-19 Vaccine (4 - 2023-24 season) 10/22/2021   INFLUENZA VACCINE  09/22/2022   Medicare Annual Wellness (AWV)  06/29/2023   Colonoscopy  08/18/2025   DTaP/Tdap/Td (2 - Td or Tdap) 11/30/2025   Pneumonia Vaccine 41+ Years old  Completed   Hepatitis C Screening  Completed   Zoster Vaccines- Shingrix  Completed   HPV VACCINES  Aged Out    Discussed health benefits of physical activity, and encouraged him to engage in regular exercise appropriate for his age and condition.  Problem List Items Addressed This Visit       Cardiovascular and Mediastinum   Primary hypertension    Chronic, stable with diet/exercise Goal <139/<79        Other   Annual physical exam - Primary    UTD on dental, has vision pending Use of hearing aides Things to do to keep yourself healthy  - Exercise at least 30-45 minutes a day, 3-4 days a week.  - Eat a low-fat diet with lots of fruits and vegetables, up to 7-9 servings per day.  - Seatbelts can save your life. Wear them always.  - Smoke  detectors on every level of your home, check batteries every year.  - Eye Doctor - have an eye exam every 1-2 years  - Safe sex - if you may be exposed to STDs, use a condom.  - Alcohol -  If you drink, do it moderately, less than 2 drinks per day.  - Health Care Power of Attorney. Choose someone to speak for you if you are not able.  - Depression is common in our stressful world.If you're feeling down or losing interest in things you normally enjoy, please come in for a visit.  - Violence - If anyone is threatening or hurting you, please call immediately.       Anxiety, generalized    Chronic, stable Remains sober x47 years Continues Zoloft at 150 mg 100+50 mg (2rx) Continue yearly f/u      Relevant Medications   sertraline (ZOLOFT) 100 MG tablet   sertraline (ZOLOFT) 50 MG tablet   Avitaminosis D    Chronic, previously stable Labs pending Denies changes in energy level/ mood      Lower urinary tract symptoms (LUTS)    Stable symptoms; PSA pending. Continue to recommend PSA in place of DRE. If PSA is elevated for age, we will repeat; if PSA remains elevated pt will be referred to urology for DRE and next steps for best treatment.       Mixed hyperlipidemia    Chronic, unknown remains on a  meat free diet LP pending recommend diet low in saturated fat and regular exercise - 30 min at least 5 times per week       Personal history of alcoholism (  HCC)    Chronic, remains sober x 47 years Continue to monitor; on zoloft 150 mg to assist with mood disorder       Prediabetes    Chronic, stable Labs pending Goal to prevent T2DM Continue to recommend balanced, lower carb meals. Smaller meal size, adding snacks. Choosing water as drink of choice and increasing purposeful exercise.       Return in about 1 year (around 08/14/2023) for annual examination.    Leilani Merl, FNP, have reviewed all documentation for this visit. The documentation on 08/12/22 for the exam,  diagnosis, procedures, and orders are all accurate and complete.  Jacky Kindle, FNP  [facility] [phone] (phone) [phone] (fax)  [facility]

## 2022-08-12 NOTE — Assessment & Plan Note (Signed)
UTD on dental, has vision pending Use of hearing aides Things to do to keep yourself healthy  - Exercise at least 30-45 minutes a day, 3-4 days a week.  - Eat a low-fat diet with lots of fruits and vegetables, up to 7-9 servings per day.  - Seatbelts can save your life. Wear them always.  - Smoke detectors on every level of your home, check batteries every year.  - Eye Doctor - have an eye exam every 1-2 years  - Safe sex - if you may be exposed to STDs, use a condom.  - Alcohol -  If you drink, do it moderately, less than 2 drinks per day.  - Health Care Power of Attorney. Choose someone to speak for you if you are not able.  - Depression is common in our stressful world.If you're feeling down or losing interest in things you normally enjoy, please come in for a visit.  - Violence - If anyone is threatening or hurting you, please call immediately.

## 2022-08-12 NOTE — Assessment & Plan Note (Signed)
Stable symptoms; PSA pending. Continue to recommend PSA in place of DRE. If PSA is elevated for age, we will repeat; if PSA remains elevated pt will be referred to urology for DRE and next steps for best treatment.

## 2022-08-12 NOTE — Assessment & Plan Note (Signed)
Chronic, stable Remains sober x47 years Continues Zoloft at 150 mg 100+50 mg (2rx) Continue yearly f/u

## 2022-08-12 NOTE — Assessment & Plan Note (Signed)
Chronic, remains sober x 47 years Continue to monitor; on zoloft 150 mg to assist with mood disorder

## 2022-08-12 NOTE — Assessment & Plan Note (Addendum)
Chronic, unknown remains on a  meat free diet LP pending recommend diet low in saturated fat and regular exercise - 30 min at least 5 times per week

## 2022-08-12 NOTE — Assessment & Plan Note (Signed)
Chronic, previously stable Labs pending Denies changes in energy level/ mood

## 2022-08-13 LAB — LIPID PANEL
Chol/HDL Ratio: 3.3 ratio (ref 0.0–5.0)
Cholesterol, Total: 193 mg/dL (ref 100–199)
HDL: 59 mg/dL (ref >39)
LDL Chol Calc (NIH): 116 mg/dL — ABNORMAL HIGH (ref 0–99)
Triglycerides: 102 mg/dL (ref 0–149)
VLDL Cholesterol Cal: 18 mg/dL (ref 5–40)

## 2022-08-13 LAB — BASIC METABOLIC PANEL
BUN/Creatinine Ratio: 22 (ref 10–24)
BUN: 21 mg/dL (ref 8–27)
CO2: 22 mmol/L (ref 20–29)
Calcium: 9.7 mg/dL (ref 8.6–10.2)
Chloride: 104 mmol/L (ref 96–106)
Creatinine, Ser: 0.95 mg/dL (ref 0.76–1.27)
Glucose: 113 mg/dL — ABNORMAL HIGH (ref 70–99)
Potassium: 5.1 mmol/L (ref 3.5–5.2)
Sodium: 143 mmol/L (ref 134–144)
eGFR: 83 mL/min/{1.73_m2} (ref >59)

## 2022-08-13 LAB — CBC
Hematocrit: 42.6 % (ref 37.5–51.0)
Hemoglobin: 14.3 g/dL (ref 13.0–17.7)
MCH: 30 pg (ref 26.6–33.0)
MCHC: 33.6 g/dL (ref 31.5–35.7)
MCV: 89 fL (ref 79–97)
Platelets: 215 10*3/uL (ref 150–450)
RBC: 4.77 x10E6/uL (ref 4.14–5.80)
RDW: 13 % (ref 11.6–15.4)
WBC: 5.4 10*3/uL (ref 3.4–10.8)

## 2022-08-13 LAB — HEMOGLOBIN A1C
Est. average glucose Bld gHb Est-mCnc: 131 mg/dL
Hgb A1c MFr Bld: 6.2 % — ABNORMAL HIGH (ref 4.8–5.6)

## 2022-08-13 LAB — TSH: TSH: 2.51 u[IU]/mL (ref 0.450–4.500)

## 2022-08-13 LAB — PSA: Prostate Specific Ag, Serum: 0.9 ng/mL (ref 0.0–4.0)

## 2022-08-13 LAB — VITAMIN D 25 HYDROXY (VIT D DEFICIENCY, FRACTURES): Vit D, 25-Hydroxy: 39.5 ng/mL (ref 30.0–100.0)

## 2022-08-14 ENCOUNTER — Encounter: Payer: Self-pay | Admitting: Family Medicine

## 2022-08-14 NOTE — Progress Notes (Signed)
The 10-year ASCVD risk score (Arnett DK, et al., 2019) is: 26.3% Glucose remains borderline in 110s.  LDL was elevated at >100. I continue to recommend diet low in saturated fat and regular exercise - 30 min at least 5 times per week A1c remains stable in pre-diabetic range at 6.2%. Continue to recommend balanced, lower carb meals. Smaller meal size, adding snacks. Choosing water as drink of choice and increasing purposeful exercise. Cell count and Vit D stable; continue supplementation of 2000 IU Vit D3 if desired to assist. PSA and thyroid remain stable.   Consider start of cholesterol medication like OTC fish oil or red yeast rice or Rx zetia or statin if desired given calculated ASCVD risk is >25% in 10 years.

## 2023-05-01 ENCOUNTER — Encounter: Payer: Self-pay | Admitting: Family Medicine

## 2023-05-01 ENCOUNTER — Ambulatory Visit: Admitting: Family Medicine

## 2023-05-01 VITALS — BP 135/69 | HR 81 | Ht 73.0 in | Wt 255.0 lb

## 2023-05-01 DIAGNOSIS — F411 Generalized anxiety disorder: Secondary | ICD-10-CM

## 2023-05-01 DIAGNOSIS — R251 Tremor, unspecified: Secondary | ICD-10-CM | POA: Insufficient documentation

## 2023-05-01 DIAGNOSIS — R221 Localized swelling, mass and lump, neck: Secondary | ICD-10-CM | POA: Insufficient documentation

## 2023-05-01 NOTE — Assessment & Plan Note (Addendum)
 Internal, mild-tender, immobile neck mass with nerve pain.  lymph node vs muscle spasm vs cyst  No other nodules/lumps felt  ROM intact, tenderness to neck flexion Denies fevers chills, infection, bites, changes in food/ medications, weight loss - Order neck ultrasound for imaging. - May take acetaminophen or ibuprofen for pain. - May heat or ice for relief - Report worsening symptoms such as radiating pain or limited range of motion.

## 2023-05-01 NOTE — Progress Notes (Signed)
 Acute Office Visit  Introduced to nurse practitioner role and practice setting.  All questions answered.  Discussed provider/patient relationship and expectations.   Subjective:     Patient ID: Jeff Baker, male    DOB: Dec 18, 1945, 78 y.o.   MRN: [id number]  Chief Complaint  Patient presents with   sore area    On the neck Noticed it about a week ago No pain in itself feels like it is pressing down on a nerve Difficult to sleep with sometimes    The patient is a 78 year old who presents with a lump on the neck causing some nerve/ MSK pain.  He has a lump on his neck that began causing discomfort about a week ago. The lump itself is not painful, but there is radiating nerve pain up to base of skull and down neck. Does not radiate into back or shoulder at this time. No recent trauma, infection, bug bites, or significant outdoor exposure that could have contributed to the lump. He experiences discomfort when moving his neck, particularly when looking down, which exacerbates the pain. He hasn't felt this pain before.  L hand tremor, particularly in his left hand, which has been present for a while per pt. 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 There is also a tremor in his right hand at times, though it is more pronounced on the left. The tremor occurs both at rest and during movement.  He describes occasional peripheral vision disturbances, which he attributes to floaters. He has had cataract surgery and continues to experience floaters. No sudden changes in vision or auditory hallucinations.  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 This condition affects his ability to sleep comfortably in a bed due to hip pain. No significant changes in gait or walking ability.  He is currently taking sertraline 150 mg daily and a multivitamin. There have been no recent changes in  his medication or diet. He experienced diarrhea last Friday, which has since resolved.   Review of Systems  All other systems reviewed and are negative.     05/01/2023   10:52 AM 08/31/2021    3:44 PM 12/21/2020    8:51 AM  GAD 7 : Generalized Anxiety Score  Nervous, Anxious, on Edge 1 1 2   Control/stop worrying 1 1 1   Worry too much - different things 1 1 1   Trouble relaxing 0 0 0  Restless 0 0 0  Easily annoyed or irritable 2 1 1   Afraid - awful might happen 1 0 1  Total GAD 7 Score 6 4 6   Anxiety Difficulty  Not difficult at all Not difficult at all    [facility] Visit from 05/01/2023 in [facility]  PHQ-9 Total Score 3          Objective:    BP 135/69   Pulse 81   Ht 6\' 1"  (1.854 m)   Wt 255 lb (115.7 kg)   SpO2 97%   BMI 33.64 kg/m    Physical Exam Constitutional:      General: He is not in acute distress.    Appearance: Normal appearance. He is not ill-appearing, toxic-appearing or diaphoretic.  HENT:     Head: Normocephalic.     Nose: Nose normal.     Mouth/Throat:     Mouth: Mucous membranes are moist.  Eyes:     Extraocular Movements: Extraocular movements intact.     Conjunctiva/sclera: Conjunctivae normal.     Pupils: Pupils are equal, round, and reactive to light.  Neck:     Thyroid: No thyroid mass, thyromegaly or thyroid tenderness.     Vascular: No carotid bruit.     Trachea: Trachea and phonation normal.     Comments: Pain to neck flexion, tenderness to palpitation to "lump" L postauricular, lateral - Deep nodule felt approx 2cmx1cm, nonmobile, mild tenderness,  Cardiovascular:     Rate and Rhythm: Normal rate and regular rhythm.     Heart sounds: No murmur heard.    No friction rub. No gallop.  Pulmonary:     Effort: Pulmonary effort is normal. No respiratory distress.     Breath sounds: Normal breath sounds. No stridor. No wheezing, rhonchi or rales.  Chest:     Chest wall: No tenderness.   Musculoskeletal:     Cervical back: No edema, erythema, signs of trauma, rigidity or crepitus. Pain with movement and muscular tenderness present. Normal range of motion.     Right lower leg: No edema.     Left lower leg: No edema.  Lymphadenopathy:     Cervical: Cervical adenopathy present.     Right cervical: No posterior cervical adenopathy.    Left cervical: Posterior cervical adenopathy present.  Skin:    General: Skin is warm and dry.     Capillary Refill: Capillary refill takes less than 2 seconds.  Neurological:     General: No focal deficit present.     Mental Status: He is alert and oriented to person, place, and time. Mental status is at baseline.     Cranial Nerves: No cranial nerve deficit.     Sensory: No sensory deficit.     Motor: No weakness.     Coordination: Coordination normal.     Gait: Gait normal.  Psychiatric:        Mood and Affect: Affect is flat.    No results found for any visits on 05/01/23.     Assessment & Plan:   Problem List Items Addressed This Visit       Other   Anxiety, generalized   Chronic, stable Continue Sertraline 150mg  daily      Lump on neck - Primary   Internal, mild-tender, immobile neck mass with nerve pain.  lymph node vs muscle spasm vs cyst  No other nodules/lumps felt  ROM intact, tenderness to neck flexion Denies fevers chills, infection, bites, changes in food/ medications, weight loss - Order neck ultrasound for imaging. - May take acetaminophen or ibuprofen for pain. - May heat or ice for relief - Report worsening symptoms such as radiating pain or limited range of motion.      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  Return for has appt on 08/19/23.  I, Sallee Provencal, FNP, have reviewed all documentation for this visit. The documentation on 05/01/23  for the exam, diagnosis, procedures, and orders are all accurate and complete.   Sallee Provencal, FNP

## 2023-05-01 NOTE — Assessment & Plan Note (Signed)
 Chronic, stable Continue Sertraline 150mg  daily

## 2023-05-01 NOTE — Assessment & Plan Note (Signed)
 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

## 2023-05-04 ENCOUNTER — Ambulatory Visit
Admission: RE | Admit: 2023-05-04 | Discharge: 2023-05-04 | Disposition: A | Discharge status: Home / Self Care | Source: Ambulatory Visit | Attending: Family Medicine | Admitting: Family Medicine

## 2023-05-04 DIAGNOSIS — R221 Localized swelling, mass and lump, neck: Secondary | ICD-10-CM | POA: Diagnosis present

## 2023-05-10 ENCOUNTER — Encounter: Payer: Self-pay | Admitting: Family Medicine

## 2023-05-22 ENCOUNTER — Telehealth: Payer: Self-pay

## 2023-05-22 NOTE — Telephone Encounter (Signed)
 Copied from CRM [id number]. Topic: Clinical - Lab/Test Results >> May 19, 2023 11:31 AM Ivette P wrote: Reason for CRM: Pt had a ultrasound done on 03/13 and has not yet received results. Pt chart shows as not released. Pt would like someone to reach out and go over his results. Pt callback [phone] / [phone]

## 2023-05-22 NOTE — Telephone Encounter (Signed)
 Called patient and advise that results are not in

## 2023-05-25 ENCOUNTER — Encounter: Payer: Self-pay | Admitting: Family Medicine

## 2023-07-03 ENCOUNTER — Ambulatory Visit (INDEPENDENT_AMBULATORY_CARE_PROVIDER_SITE_OTHER): Payer: Medicare PPO

## 2023-07-03 VITALS — Ht 73.0 in | Wt 244.0 lb

## 2023-07-03 DIAGNOSIS — Z0000 Encounter for general adult medical examination without abnormal findings: Secondary | ICD-10-CM | POA: Diagnosis not present

## 2023-07-03 NOTE — Progress Notes (Signed)
 Please attest and cosign this visit due to patients primary care provider not being in the office at the time the visit was completed.   Subjective:   Jeff Baker is a 78 y.o. who presents for a Medicare Wellness preventive visit.  Visit Complete: Virtual I connected with  Jeff Baker on 07/03/23 by a audio enabled telemedicine application and verified that I am speaking with the correct person using two identifiers.  Patient Location: Home  Provider Location: Home Office  I discussed the limitations of evaluation and management by telemedicine. The patient expressed understanding and agreed to proceed.  Vital Signs: Because this visit was a virtual/telehealth visit, some criteria may be missing or patient reported. Any vitals not documented were not able to be obtained and vitals that have been documented are patient reported.  VideoDeclined- This patient declined Librarian, academic. Therefore the visit was completed with audio only.  AWV Questionnaire: Yes: Patient Medicare AWV questionnaire was completed by the patient on 06/30/2023; I have confirmed that all information answered by patient is correct and no changes since this date.  Cardiac Risk Factors include: dyslipidemia;hypertension;male gender;obesity (BMI >30kg/m2)     Objective:     Today's Vitals   07/03/23 0856  Weight: 244 lb (110.7 kg)  Height: 6\' 1"  (1.854 m)   Body mass index is 32.19 kg/m.     07/03/2023    9:54 AM 06/29/2022    8:56 AM 05/10/2022    7:21 PM 08/18/2020    7:45 AM 01/23/2020    9:30 AM 08/17/2019    1:55 PM 06/11/2019   11:47 AM  Advanced Directives  Does Patient Have a Medical Advance Directive? No Yes No No No No Yes  Type of Tax inspector;Living will  Copy of Healthcare Power of Attorney in Chart?       No - copy requested  Would patient like information on creating a medical advance directive? Yes  (MAU/Ambulatory/Procedural Areas - Information given)   No - Patient declined No - Patient declined No - Patient declined     Current Medications (verified) Outpatient Encounter Medications as of 07/03/2023  Medication Sig   Cholecalciferol (VITAMIN D3) 50 MCG (2000 UT) CAPS Take 2,000 Units by mouth daily.   Multiple Vitamins-Minerals (MULTIVITAMIN GUMMIES ADULT PO) Take 1 Dose by mouth once.   sertraline  (ZOLOFT ) 100 MG tablet Take 1 tablet (100 mg total) by mouth daily.   sertraline  (ZOLOFT ) 50 MG tablet Take 1 tablet (50 mg total) by mouth daily. Take with 100 mg for total 150 mg   No facility-administered encounter medications on file as of 07/03/2023.    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   Past Surgical History:  Procedure Laterality Date   CESAREAN SECTION  2011   Just to  see if you're actually paying attention   COLONOSCOPY WITH PROPOFOL  N/A 04/28/2015   Procedure: COLONOSCOPY WITH PROPOFOL ;  Surgeon: Marnee Sink, MD;  Location: [facility] ENDOSCOPY;  Service: Endoscopy;  Laterality: N/A;   COLONOSCOPY WITH PROPOFOL  N/A 08/18/2020   Procedure: COLONOSCOPY WITH PROPOFOL ;  Surgeon: Marnee Sink, MD;  Location: [facility] ENDOSCOPY;  Service: Endoscopy;  Laterality: N/A;   EXTRACORPOREAL SHOCK WAVE LITHOTRIPSY Left 01/23/2020   Procedure: EXTRACORPOREAL SHOCK WAVE LITHOTRIPSY (ESWL);  Surgeon: Dustin Gimenez, MD;  Location: [facility] ORS;  Service: Urology;   Laterality: Left;   EYE SURGERY  2022   NO PAST SURGERIES     Family History  Problem Relation Age of Onset   Dementia Mother    Anxiety disorder Mother    Alcohol abuse Father    Drug abuse Father    Arthritis Father    Early death Father    Pulmonary disease Brother    Kidney disease Son    Prostate cancer Neg Hx    Bladder Cancer Neg Hx    Kidney cancer Neg Hx    Social History   Socioeconomic History   Marital status: Married    Spouse name: Not on file   Number of children: 2   Years of education: Not on file   Highest education level: Bachelor's degree (e.g., BA, AB, BS)  Occupational History   Occupation: Tax adviser    Comment: full time  Tobacco Use   Smoking status: Former    Current packs/day: 0.00    Types: Cigars, Cigarettes    Start date: 23    Quit date: 12/03/2003    Years since quitting: 19.5   Smokeless tobacco: Never   Tobacco comments:    It was a brief, lovely, flirtation  Vaping Use   Vaping status: Never Used  Substance and Sexual Activity   Alcohol use: No    Comment: had a history of alcohol abuse been sober >31 years   Drug use: No   Sexual activity: Yes    Birth control/protection: None    Comment: Old age is a pretty effective form of birth control  Other Topics Concern   Not on file  Social History Narrative   Not on file   Social Drivers of Health   Financial Resource Strain: Low Risk  (07/03/2023)   Overall Financial Resource Strain (CARDIA)    Difficulty of Paying Living Expenses: Not hard at all  Food Insecurity: No Food Insecurity (07/03/2023)   Hunger Vital Sign    Worried About Running Out of Food in the Last Year: Never true    Ran Out of Food in the Last Year: Never true  Transportation Needs: No Transportation Needs (07/03/2023)   PRAPARE - Administrator, Civil Service (Medical): No    Lack of Transportation (Non-Medical): No  Physical Activity: Sufficiently Active (07/03/2023)   Exercise Vital  Sign    Days of Exercise per Week: 2 days    Minutes of Exercise per Session: 150+ min  Recent Concern: Physical Activity - Insufficiently Active (04/29/2023)   Exercise Vital Sign    Days of Exercise per Week: 2 days    Minutes of Exercise per Session: 30 min  Stress: Stress Concern Present (07/03/2023)   [employer] of Occupational Health - Occupational Stress Questionnaire    Feeling of Stress : To some extent  Social Connections: Moderately Integrated (07/03/2023)   Social Connection and Isolation Panel [NHANES]    Frequency of Communication with Friends and Family:  More than three times a week    Frequency of Social Gatherings with Friends and Family: Once a week    Attends Religious Services: Never    Database administrator or Organizations: Yes    Attends Engineer, structural: More than 4 times per year    Marital Status: Married  Recent Concern: Social Connections - Moderately Isolated (04/29/2023)   Social Connection and Isolation Panel [NHANES]    Frequency of Communication with Friends and Family: More than three times a week    Frequency of Social Gatherings with Friends and Family: Once a week    Attends Religious Services: Never    Database administrator or Organizations: No    Attends Engineer, structural: Not on file    Marital Status: Married    Tobacco Counseling Counseling given: Yes Tobacco comments: It was a brief, lovely, flirtation    Clinical Intake:  Pre-visit preparation completed: Yes  Pain : No/denies pain     BMI - recorded: 32.19 Nutritional Status: BMI > 30  Obese Nutritional Risks: None Diabetes: No  How often do you need to have someone help you when you read instructions, pamphlets, or other written materials from your doctor or pharmacy?: 1 - Never  Interpreter Needed?: No  Information entered by :: Sally Crazier CMA   Activities of Daily Living     06/30/2023    5:03 PM  In your present state of health, do you have  any difficulty performing the following activities:  Hearing? 0  Comment wears hearing aids  Vision? 0  Difficulty concentrating or making decisions? 0  Walking or climbing stairs? 0  Dressing or bathing? 0  Doing errands, shopping? 0  Preparing Food and eating ? N  Using the Toilet? N  In the past six months, have you accidently leaked urine? N  Do you have problems with loss of bowel control? N  Managing your Medications? N  Managing your Finances? N  Housekeeping or managing your Housekeeping? N    Patient Care Team: Tasia Farr, FNP as PCP - General (Family Medicine) [facility], [facility] (Optometry)  Indicate any recent Medical Services you may have received from other than [facility] providers in the past year (date may be approximate).     Assessment:    This is a routine wellness examination for Jeff Baker.  Hearing/Vision screen Hearing Screening - Comments:: Patient wears hearing aids.  Vision Screening - Comments:: Wears rx glasses - up to date with routine eye exams  Patient [facility]     Goals Addressed             This Visit's Progress    DIET - INCREASE WATER INTAKE   On track    Recommend to drink at least 6-8 8oz glasses of water per day.     Patient Stated       I want to go on a 2 week fly fishing trip in [place]        Depression Screen     07/03/2023    9:09 AM 05/01/2023   10:51 AM 06/29/2022    9:13 AM 08/31/2021    3:44 PM 06/18/2021    8:45 AM 12/21/2020    8:50 AM 10/14/2020    8:48 AM  PHQ 2/9 Scores  PHQ - 2 Score 0 0 0 0 0 0 0  PHQ- 9 Score 0 3  0 1 1 1     Fall  Risk     06/30/2023    5:03 PM 06/28/2022    9:40 AM 08/31/2021    3:43 PM 06/17/2021    2:22 PM 12/21/2020    8:50 AM  Fall Risk   Falls in the past year? 0 0 0 0 0  Number falls in past yr: 0 0 0 0 0  Injury with Fall? 0 0 0  0  Risk for fall due to : No Fall Risks    No Fall Risks  Follow up Falls prevention discussed;Falls evaluation completed     Falls evaluation completed    MEDICARE RISK AT HOME:  Medicare Risk at Home Any stairs in or around the home?: Yes If so, are there any without handrails?: No Home free of loose throw rugs in walkways, pet beds, electrical cords, etc?: Yes Adequate lighting in your home to reduce risk of falls?: Yes Life alert?: No Use of a cane, walker or w/c?: No Grab bars in the bathroom?: Yes Shower chair or bench in shower?: Yes Elevated toilet seat or a handicapped toilet?: Yes  TIMED UP AND GO:  Was the test performed?  No  Cognitive Function: 6CIT completed        07/03/2023    9:02 AM 06/29/2022    8:56 AM 06/11/2019   11:55 AM  6CIT Screen  What Year? 0 points 0 points 0 points  What month? 0 points 0 points 0 points  What time? 0 points 0 points 0 points  Count back from 20 0 points 0 points 0 points  Months in reverse 0 points 0 points 0 points  Repeat phrase 0 points 0 points 2 points  Total Score 0 points 0 points 2 points    Immunizations Immunization History  Administered Date(s) Administered   Fluad Quad(high Dose 65+) 12/05/2018, 12/30/2019, 12/21/2020   Influenza, High Dose Seasonal PF 12/24/2016, 12/08/2017   Influenza,inj,Quad PF,6+ Mos 03/09/2015   Influenza-Unspecified 12/07/2013   PFIZER(Purple Top)SARS-COV-2 Vaccination 04/06/2019, 04/27/2019, 11/18/2019, 12/21/2020, 05/01/2022   Pneumococcal Conjugate-13 02/26/2014   Pneumococcal Polysaccharide-23 04/01/2011   Tdap 12/01/2015   Zoster Recombinant(Shingrix ) 03/20/2017, 04/16/2018    Screening Tests Health Maintenance  Topic Date Due   COVID-19 Vaccine (6 - 2024-25 season) 10/23/2022   INFLUENZA VACCINE  09/22/2023   Medicare Annual Wellness (AWV)  07/02/2024   Colonoscopy  08/18/2025   DTaP/Tdap/Td (2 - Td or Tdap) 11/30/2025   Pneumonia Vaccine 39+ Years old  Completed   Hepatitis C Screening  Completed   Zoster Vaccines- Shingrix   Completed   HPV VACCINES  Aged Out   Meningococcal B Vaccine  Aged  Out    Health Maintenance  Health Maintenance Due  Topic Date Due   COVID-19 Vaccine (6 - 2024-25 season) 10/23/2022   Health Maintenance Items Addressed: Health Maintenance is up to date.   Additional Screening:  Vision Screening: Recommended annual ophthalmology exams for early detection of glaucoma and other disorders of the eye.  Dental Screening: Recommended annual dental exams for proper oral hygiene  Community Resource Referral / Chronic Care Management: CRR required this visit?  No   CCM required this visit?  No     Plan:     I have personally reviewed and noted the following in the patient's chart:   Medical and social history Use of alcohol, tobacco or illicit drugs  Current medications and supplements including opioid prescriptions. Patient is not currently taking opioid prescriptions. Functional ability and status Nutritional status Physical activity Advanced directives List  of other physicians Hospitalizations, surgeries, and ER visits in previous 12 months Vitals Screenings to include cognitive, depression, and falls Referrals and appointments  In addition, I have reviewed and discussed with patient certain preventive protocols, quality metrics, and best practice recommendations. A written personalized care plan for preventive services as well as general preventive health recommendations were provided to patient.     Jeff Baker, CMA   07/03/2023   After Visit Summary: (MyChart) Due to this being a telephonic visit, the after visit summary with patients personalized plan was offered to patient via MyChart   Notes: Nothing significant to report at this time.

## 2023-07-03 NOTE — Patient Instructions (Signed)
 Mr. Jeff Baker , Thank you for taking time out of your busy schedule to complete your Annual Wellness Visit with me. I enjoyed our conversation and look forward to speaking with you again next year. I, as well as your care team,  appreciate your ongoing commitment to your health goals. Please review the following plan we discussed and let me know if I can assist you in the future. Your Game plan/ To Do List    Referrals: If you haven't heard from the office you've been referred to, please reach out to them at the phone number provided.  N/a Follow up Visits: Next Medicare AWV with our clinical staff: Jul 09, 2024 at 3:10 pm video visit   Have you seen your provider in the last 6 months (3 months if uncontrolled diabetes)? Yes Next Office Visit with your provider: August 15, 2023 at 9:00 w/ [facility] for your physical  Clinician Recommendations:  Aim for 30 minutes of exercise or brisk walking, 6-8 glasses of water, and 5 servings of fruits and vegetables each day. Continue to enjoy your weekly fly fishing days!!      This is a list of the screening recommended for you and due dates:  Health Maintenance  Topic Date Due   COVID-19 Vaccine (6 - 2024-25 season) 10/23/2022   Flu Shot  09/22/2023   Medicare Annual Wellness Visit  07/02/2024   Colon Cancer Screening  08/18/2025   DTaP/Tdap/Td vaccine (2 - Td or Tdap) 11/30/2025   Pneumonia Vaccine  Completed   Hepatitis C Screening  Completed   Zoster (Shingles) Vaccine  Completed   HPV Vaccine  Aged Out   Meningitis B Vaccine  Aged Out    Advanced directives: (ACP Link)Information on Advanced Care Planning can be found at [place]  Secretary of [facility] Advance Health Care Directives Advance Health Care Directives. [email or web address]  Advance Care Planning is important because it:  [x]  Makes sure you receive the medical care that is consistent with your values, goals, and preferences  [x]  It provides guidance to your family and loved ones and  reduces their decisional burden about whether or not they are making the right decisions based on your wishes.  Follow the link provided in your after visit summary or read over the paperwork we have mailed to you to help you started getting your Advance Directives in place. If you need assistance in completing these, please reach out to us  so that we can help you!  See attachments for Preventive Care and Fall Prevention Tips. Fall Prevention in the Home, Adult Falls can cause injuries and affect people of all ages. There are many simple things that you can do to make your home safe and to help prevent falls. If you need it, ask for help making these changes. What actions can I take to prevent falls? General information Use good lighting in all rooms. Make sure to: Replace any light bulbs that burn out. Turn on lights if it is dark and use night-lights. Keep items that you use often in easy-to-reach places. Lower the shelves around your home if needed. Move furniture so that there are clear paths around it. Do not keep throw rugs or other things on the floor that can make you trip. If any of your floors are uneven, fix them. Add color or contrast paint or tape to clearly mark and help you see: Grab bars or handrails. First and last steps of staircases. Where the edge of each step is. If you  use a ladder or stepladder: Make sure that it is fully opened. Do not climb a closed ladder. Make sure the sides of the ladder are locked in place. Have someone hold the ladder while you use it. Know where your pets are as you move through your home. What can I do in the bathroom?     Keep the floor dry. Clean up any water that is on the floor right away. Remove soap buildup in the bathtub or shower. Buildup makes bathtubs and showers slippery. Use non-skid mats or decals on the floor of the bathtub or shower. Attach bath mats securely with double-sided, non-slip rug tape. If you need to sit down  while you are in the shower, use a non-slip stool. Install grab bars by the toilet and in the bathtub and shower. Do not use towel bars as grab bars. What can I do in the bedroom? Make sure that you have a light by your bed that is easy to reach. Do not use any sheets or blankets on your bed that hang to the floor. Have a firm bench or chair with side arms that you can use for support when you get dressed. What can I do in the kitchen? Clean up any spills right away. If you need to reach something above you, use a sturdy step stool that has a grab bar. Keep electrical cables out of the way. Do not use floor polish or wax that makes floors slippery. What can I do with my stairs? Do not leave anything on the stairs. Make sure that you have a light switch at the top and the bottom of the stairs. Have them installed if you do not have them. Make sure that there are handrails on both sides of the stairs. Fix handrails that are broken or loose. Make sure that handrails are as long as the staircases. Install non-slip stair treads on all stairs in your home if they do not have carpet. Avoid having throw rugs at the top or bottom of stairs, or secure the rugs with carpet tape to prevent them from moving. Choose a carpet design that does not hide the edge of steps on the stairs. Make sure that carpet is firmly attached to the stairs. Fix any carpet that is loose or worn. What can I do on the outside of my home? Use bright outdoor lighting. Repair the edges of walkways and driveways and fix any cracks. Clear paths of anything that can make you trip, such as tools or rocks. Add color or contrast paint or tape to clearly mark and help you see high doorway thresholds. Trim any bushes or trees on the main path into your home. Check that handrails are securely fastened and in good repair. Both sides of all steps should have handrails. Install guardrails along the edges of any raised decks or porches. Have  leaves, snow, and ice cleared regularly. Use sand, salt, or ice melt on walkways during winter months if you live where there is ice and snow. In the garage, clean up any spills right away, including grease or oil spills. What other actions can I take? Review your medicines with your health care provider. Some medicines can make you confused or feel dizzy. This can increase your chance of falling. Wear closed-toe shoes that fit well and support your feet. Wear shoes that have rubber soles and low heels. Use a cane, walker, scooter, or crutches that help you move around if needed. Talk with your provider  about other ways that you can decrease your risk of falls. This may include seeing a physical therapist to learn to do exercises to improve movement and strength. Where to find more information Centers for Disease Control and Prevention, STEADI: TonerPromos.no [employer] on Aging: BaseRingTones.pl National Institute on Aging: BaseRingTones.pl Contact a health care provider if: You are afraid of falling at home. You feel weak, drowsy, or dizzy at home. You fall at home. Get help right away if you: Lose consciousness or have trouble moving after a fall. Have a fall that causes a head injury. These symptoms may be an emergency. Get help right away. Call 911. Do not wait to see if the symptoms will go away. Do not drive yourself to the hospital. This information is not intended to replace advice given to you by your health care provider. Make sure you discuss any questions you have with your health care provider. Document Revised: 10/11/2021 Document Reviewed: 10/11/2021 Elsevier Patient Education  2024 [employer].

## 2023-07-18 ENCOUNTER — Telehealth: Payer: Self-pay | Admitting: Family Medicine

## 2023-07-18 NOTE — Telephone Encounter (Signed)
 Called and left message that provider will be out of office on 6/24 so appt is cancelled. If patient calls back ok to reschedule

## 2023-08-07 ENCOUNTER — Telehealth: Payer: Self-pay | Admitting: Family Medicine

## 2023-08-07 ENCOUNTER — Other Ambulatory Visit: Payer: Self-pay

## 2023-08-07 DIAGNOSIS — F411 Generalized anxiety disorder: Secondary | ICD-10-CM

## 2023-08-07 NOTE — Telephone Encounter (Signed)
 [facility] faxed refill request for the following medications:   sertraline (ZOLOFT) 100 MG tablet     Please advise

## 2023-08-08 ENCOUNTER — Other Ambulatory Visit: Payer: Self-pay

## 2023-08-08 ENCOUNTER — Other Ambulatory Visit: Payer: Self-pay | Admitting: Family Medicine

## 2023-08-08 DIAGNOSIS — F411 Generalized anxiety disorder: Secondary | ICD-10-CM

## 2023-08-08 MED ORDER — SERTRALINE HCL 50 MG PO TABS: 50.0000 mg | ORAL_TABLET | Freq: Every day | ORAL | 3 refills | Status: DC

## 2023-08-08 MED ORDER — SERTRALINE HCL 100 MG PO TABS: 100.0000 mg | ORAL_TABLET | Freq: Every day | ORAL | 3 refills | Status: AC

## 2023-08-08 NOTE — Telephone Encounter (Signed)
 After speaking with the patient will keep him on the 100 and 50 mg pills.  He states that when he breaks the 100 mg pill it is very bitter and would prefer to just pay the 2 co-pays

## 2023-08-15 ENCOUNTER — Encounter: Payer: Medicare PPO | Admitting: Family Medicine

## 2023-08-17 ENCOUNTER — Encounter: Payer: Self-pay | Admitting: Family Medicine

## 2023-08-18 ENCOUNTER — Other Ambulatory Visit: Payer: Self-pay | Admitting: Family Medicine

## 2023-08-18 DIAGNOSIS — Z0000 Encounter for general adult medical examination without abnormal findings: Secondary | ICD-10-CM

## 2023-08-18 DIAGNOSIS — E782 Mixed hyperlipidemia: Secondary | ICD-10-CM

## 2023-08-18 DIAGNOSIS — R7303 Prediabetes: Secondary | ICD-10-CM

## 2023-08-18 DIAGNOSIS — I10 Essential (primary) hypertension: Secondary | ICD-10-CM

## 2023-08-18 DIAGNOSIS — Z125 Encounter for screening for malignant neoplasm of prostate: Secondary | ICD-10-CM

## 2023-08-21 ENCOUNTER — Encounter: Payer: Self-pay | Admitting: Family Medicine

## 2023-08-21 ENCOUNTER — Encounter: Admitting: Family Medicine

## 2023-08-21 ENCOUNTER — Ambulatory Visit (INDEPENDENT_AMBULATORY_CARE_PROVIDER_SITE_OTHER): Admitting: Family Medicine

## 2023-08-21 VITALS — BP 136/70 | HR 67 | Temp 97.7°F | Ht 73.0 in | Wt 260.9 lb

## 2023-08-21 DIAGNOSIS — I10 Essential (primary) hypertension: Secondary | ICD-10-CM

## 2023-08-21 DIAGNOSIS — F411 Generalized anxiety disorder: Secondary | ICD-10-CM

## 2023-08-21 DIAGNOSIS — Z0000 Encounter for general adult medical examination without abnormal findings: Secondary | ICD-10-CM | POA: Diagnosis not present

## 2023-08-21 DIAGNOSIS — R7303 Prediabetes: Secondary | ICD-10-CM | POA: Diagnosis not present

## 2023-08-21 NOTE — Progress Notes (Signed)
 Complete physical exam  Patient: Jeff Baker   DOB: 1946/02/17   78 y.o. Male  MRN: [id number]  Subjective:    Chief Complaint  Patient presents with   Annual Exam    Last CPE- 08/12/2023 Diet- Vegetarian for 40 years Exercise- roughly 6-8 hours a week Feeling- Feels fine Sleeping- Fine, wakes up 2 times throughout the night to use restroom and normally does not have problems going back to sleep. Concerns- None    Jeff Baker is a 78 y.o. male who presents today for a complete physical exam. He reports consuming a vegetarian diet. Gym/ health club routine includes softball and fly fishing. He generally feels well. He reports sleeping fairly well. He does not have additional problems to discuss today.   Discussed the use of AI scribe software for clinical note transcription with the patient, who gave verbal consent to proceed.  History of Present Illness Jeff Baker is a 78 year old male who presents for a routine follow-up visit.  He has completed his lab work and humorously notes irritability when fasting for too long. He had to prompt for the results of his recent ultrasound. No recent falls and his mood is good.  He wears dentures and has about seven natural teeth remaining in the front. His new glasses are not working properly for close-up work, causing blurring after a while. He has been prescribed single vision glasses for his typical work distance but has not yet received them. His hearing aids function adequately for daily activities.  He follows a vegetarian diet but includes dairy products. Until two weeks ago, he was playing softball twice a week and fly fishing for six to eight hours against the current on Saturdays. The softball league ended due to the summer heat. He plays shortstop in softball. He wakes up a couple of times at night to use the restroom and sometimes has trouble falling back asleep, but usually manages by doing puzzles like  Wordle.  He has a history of smoking but quit many years ago. He is currently taking multivitamins, vitamin D3, and Zoloft . He takes 150mg  of sertraline  for mood.  He has a history of prediabetes and mentions that his sugar levels historically ran on higher side.    Most recent fall risk assessment:    08/21/2023    1:52 PM  Fall Risk   Falls in the past year? 0  Number falls in past yr: 0  Injury with Fall? 0  Risk for fall due to : No Fall Risks     Most recent depression screenings:    08/21/2023    1:52 PM 07/03/2023    9:09 AM  PHQ 2/9 Scores  PHQ - 2 Score 0 0  PHQ- 9 Score 0 0     Vision:Within last year and Dental: No current dental problems and No regular dental care   Patient Active Problem List   Diagnosis Date Noted   Lump on neck 05/01/2023   Tremor 05/01/2023   Mixed hyperlipidemia 08/12/2022   Primary hypertension 08/12/2022   Annual physical exam 06/18/2021   Lower urinary tract symptoms (LUTS) 06/18/2021   Encounter for subsequent annual wellness visit (AWV) in Medicare patient 06/18/2021   Avitaminosis D 12/21/2020   Prediabetes 10/23/2019   Anxiety, generalized 12/11/2006   Personal history of alcoholism (HCC) 12/11/2006   Past Medical History:  Diagnosis Date   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   Past Surgical History:  Procedure Laterality Date   CESAREAN SECTION  2011   Just to see if you're actually paying attention   COLONOSCOPY WITH PROPOFOL  N/A 04/28/2015   Procedure: COLONOSCOPY WITH PROPOFOL ;  Surgeon: Rogelia Copping, MD;  Location: [facility] ENDOSCOPY;  Service: Endoscopy;  Laterality: N/A;   COLONOSCOPY WITH PROPOFOL  N/A 08/18/2020   Procedure: COLONOSCOPY WITH PROPOFOL ;  Surgeon: Copping Rogelia, MD;  Location: [facility] ENDOSCOPY;  Service: Endoscopy;  Laterality: N/A;   EXTRACORPOREAL SHOCK WAVE LITHOTRIPSY Left 01/23/2020   Procedure: EXTRACORPOREAL SHOCK WAVE LITHOTRIPSY (ESWL);  Surgeon: Penne Knee, MD;  Location: [facility] ORS;  Service: Urology;  Laterality: Left;   EYE SURGERY  2022   NO PAST SURGERIES     Social History   Tobacco Use   Smoking status: Former    Current packs/day: 0.00    Types: Cigarettes, Cigars    Start date: 2000    Quit date: 12/03/2003    Years since quitting: 19.7   Smokeless tobacco: Never   Tobacco comments:    It was a brief, lovely, flirtation  Vaping Use   Vaping status: Never Used  Substance Use Topics   Alcohol use: No    Comment: had a history of alcohol abuse been sober >31 years   Drug use: No   Family History  Problem Relation Age of Onset   Dementia Mother    Anxiety disorder Mother    Alcohol abuse Father    Drug abuse Father    Arthritis Father    Early death Father    Pulmonary disease Brother    Kidney disease Son    Prostate cancer Neg Hx    Bladder Cancer Neg Hx    Kidney cancer Neg Hx    Allergies  Allergen Reactions   Bactrim  [Sulfamethoxazole -Trimethoprim ]     Reports mild skin rash, stomach cramps and diarrhea.   Latex Rash      Patient Care Team: Wellington Curtis LABOR, FNP as PCP - General (Family Medicine) [facility], [facility])   Outpatient Medications Prior to Visit  Medication Sig   Cholecalciferol (VITAMIN D3) 50 MCG (2000 UT) CAPS Take 2,000 Units by mouth daily.   Multiple Vitamins-Minerals (MULTIVITAMIN GUMMIES ADULT PO) Take 1 Dose by mouth once.   sertraline  (ZOLOFT ) 100 MG tablet Take 1 tablet (100 mg total) by mouth daily. (Patient taking differently: Take 150 mg by mouth daily.)   No  facility-administered medications prior to visit.    Review of Systems  All other systems reviewed and are negative.         Objective:     BP 136/70 (BP Location: Left Arm, Patient Position: Sitting, Cuff Size: Normal)   Pulse 67   Temp 97.7 F (36.5 C) (Oral)   Ht 6' 1 (1.854 m)   Wt 260 lb 14.4 oz (118.3 kg)   SpO2 97%   BMI 34.42 kg/m  BP Readings from Last 3 Encounters:  08/21/23 136/70  05/01/23  135/69  08/12/22 133/68   Wt Readings from Last 3 Encounters:  08/21/23 260 lb 14.4 oz (118.3 kg)  07/03/23 244 lb (110.7 kg)  05/01/23 255 lb (115.7 kg)     Physical Exam Constitutional:      General: He is not in acute distress.    Appearance: Normal appearance. He is obese. He is not ill-appearing.  HENT:     Head: Normocephalic.     Right Ear: Tympanic membrane normal.     Left Ear: Tympanic membrane normal.     Ears:     Comments: Hearing aids present, pt able to remove for exam    Nose: Nose normal. No congestion or rhinorrhea.     Mouth/Throat:     Mouth: Mucous membranes are moist.     Pharynx: Oropharynx is clear. No oropharyngeal exudate or posterior oropharyngeal erythema.   Eyes:     General: Lids are normal. No visual field deficit.    Extraocular Movements: Extraocular movements intact.     Right eye: Normal extraocular motion.     Left eye: Normal extraocular motion.     Conjunctiva/sclera: Conjunctivae normal.     Right eye: Right conjunctiva is not injected.     Left eye: Left conjunctiva is not injected.     Pupils: Pupils are equal, round, and reactive to light.   Neck:     Thyroid : No thyroid  mass, thyromegaly or thyroid  tenderness.     Vascular: No carotid bruit.   Cardiovascular:     Rate and Rhythm: Normal rate and regular rhythm.     Pulses: Normal pulses.          Radial pulses are 2+ on the right side and 2+ on the left side.       Posterior tibial pulses are 2+ on the right side and 2+ on the left side.     Heart sounds:  Normal heart sounds, S1 normal and S2 normal. No murmur heard.    No friction rub. No gallop.  Pulmonary:     Effort: Pulmonary effort is normal. No respiratory distress.     Breath sounds: Normal breath sounds. No stridor. No wheezing, rhonchi or rales.  Abdominal:     General: Bowel sounds are normal. There is no distension.     Palpations: Abdomen is soft. There is no mass.     Tenderness: There is no abdominal tenderness. There is no guarding or rebound.     Hernia: No hernia is present.  Genitourinary:    Comments: Not assessed  Musculoskeletal:        General: No swelling or tenderness. Normal range of motion.     Cervical back: Normal range of motion. No rigidity or tenderness.  Lymphadenopathy:     Cervical: No cervical adenopathy.     Right cervical: No superficial, deep or posterior cervical adenopathy.    Left cervical: No superficial, deep or posterior cervical adenopathy.   Skin:    General: Skin is warm and dry.     Capillary Refill: Capillary refill takes less than 2 seconds.     Findings: No bruising or erythema.   Neurological:     General: No focal deficit present.     Mental Status: He is alert and oriented to person, place, and time. Mental status is at baseline.     GCS: GCS eye subscore is 4. GCS verbal subscore is 5. GCS motor subscore is 6.     Cranial Nerves: Cranial nerves 2-12 are  intact. No cranial nerve deficit, dysarthria or facial asymmetry.     Sensory: No sensory deficit.     Motor: No weakness, tremor or pronator drift.     Coordination: Romberg sign negative. Coordination normal.     Gait: Gait is intact. Gait normal.     Comments: Baseline fine motor tremor bilateral hands  Psychiatric:        Attention and Perception: Attention and perception normal.        Mood and Affect: Mood and affect normal.        Speech: Speech normal.        Behavior: Behavior normal. Behavior is cooperative.        Thought Content: Thought content normal.         Cognition and Memory: Cognition and memory normal.        Judgment: Judgment normal.      No results found for any visits on 08/21/23.     Assessment & Plan:    Routine Health Maintenance and Physical Exam  Assessment and Plan Assessment & Plan Prediabetes Elevated blood sugar levels without diabetes diagnosis. Discussed potential weight loss medication if A1c is elevated. Considered statin use based on ASCVD risk. - Order labs including A1c. - Discuss potential medication for weight loss if A1c is elevated. - Consider shared decision-making for statin use based on ASCVD risk. -Continue to make conscious decisions for well balanced diet smaller portions with increase protein, fruits, veggies, water as drink of choice, decrease starches, processed foods, and saturated fats. Increase weekly exercise - 150 minutes per week.   HTN Diet controlled GOAL<130/80  GAD - continue zoloft  150mg  daily  General Health Maintenance Former smoker, engages in regular physical activity. Discussed importance of oral cancer screenings and hydration. Encouraged sunscreen use. - Encourage regular oral cancer screenings. - Encourage hydration and sunscreen use. - Encourage regular physical activity.  Things to do to keep yourself healthy  - Exercise at least 30-45 minutes a day, 3-4 days a week.  - Eat a low-fat diet with lots of fruits and vegetables, up to 7-9 servings per day.  - Seatbelts can save your life. Wear them always.  - Smoke detectors on every level of your home, check batteries every year.  - Eye Doctor - have an eye exam every 1-2 years  - Safe sex - if you may be exposed to STDs, use a condom.  - Alcohol -  If you drink, do it moderately, less than 2 drinks per day.  - Health Care Power of Attorney. Choose someone to speak for you if you are not able.  - Depression is common in our stressful world.If you're feeling down or losing interest in things you normally enjoy, please come  in for a visit.  - Violence - If anyone is threatening or hurting you, please call immediately.  - Schedule follow-up appointment in six months. - Ensure Zoloft  prescription is updated to reflect 100 mg and 50 mg tablets.   The 10-year ASCVD risk score (Arnett DK, et al., 2019) is: 28.9%   Values used to calculate the score:     Age: 68 years     Clincally relevant sex: Male     Is Non-Hispanic African American: No     Diabetic: No     Tobacco smoker: No     Systolic Blood Pressure: 136 mmHg     Is BP treated: No     HDL Cholesterol: 59 mg/dL  Total Cholesterol: 193 mg/dL    Health Maintenance  Topic Date Due   COVID-19 Vaccine (6 - 2024-25 season) 10/23/2022   Flu Shot  09/22/2023   Medicare Annual Wellness Visit  07/02/2024   Colon Cancer Screening  08/18/2025   DTaP/Tdap/Td vaccine (2 - Td or Tdap) 11/30/2025   Pneumococcal Vaccine for age over 44  Completed   Hepatitis C Screening  Completed   Zoster (Shingles) Vaccine  Completed   Hepatitis B Vaccine  Aged Out   HPV Vaccine  Aged Out   Meningitis B Vaccine  Aged Out    Discussed health benefits of physical activity, and encouraged him to engage in regular exercise appropriate for his age and condition.  There are no diagnoses linked to this encounter.   No follow-ups on file.     Curtis DELENA Boom, FNP

## 2023-08-22 ENCOUNTER — Other Ambulatory Visit: Payer: Self-pay | Admitting: Family Medicine

## 2023-08-22 ENCOUNTER — Ambulatory Visit: Payer: Self-pay | Admitting: Family Medicine

## 2023-08-22 LAB — COMPREHENSIVE METABOLIC PANEL WITH GFR
ALT: 22 IU/L (ref 0–44)
AST: 20 IU/L (ref 0–40)
Albumin: 4.3 g/dL (ref 3.8–4.8)
Alkaline Phosphatase: 80 IU/L (ref 44–121)
BUN/Creatinine Ratio: 18 (ref 10–24)
BUN: 17 mg/dL (ref 8–27)
Bilirubin Total: 0.6 mg/dL (ref 0.0–1.2)
CO2: 22 mmol/L (ref 20–29)
Calcium: 9.1 mg/dL (ref 8.6–10.2)
Chloride: 108 mmol/L — ABNORMAL HIGH (ref 96–106)
Creatinine, Ser: 0.97 mg/dL (ref 0.76–1.27)
Globulin, Total: 2.4 g/dL (ref 1.5–4.5)
Glucose: 116 mg/dL — ABNORMAL HIGH (ref 70–99)
Potassium: 5 mmol/L (ref 3.5–5.2)
Sodium: 148 mmol/L — ABNORMAL HIGH (ref 134–144)
Total Protein: 6.7 g/dL (ref 6.0–8.5)
eGFR: 80 mL/min/{1.73_m2} (ref >59)

## 2023-08-22 LAB — HEMOGLOBIN A1C
Est. average glucose Bld gHb Est-mCnc: 128 mg/dL
Hgb A1c MFr Bld: 6.1 % — ABNORMAL HIGH (ref 4.8–5.6)

## 2023-08-22 LAB — CBC
Hematocrit: 42.8 % (ref 37.5–51.0)
Hemoglobin: 14.3 g/dL (ref 13.0–17.7)
MCH: 31 pg (ref 26.6–33.0)
MCHC: 33.4 g/dL (ref 31.5–35.7)
MCV: 93 fL (ref 79–97)
Platelets: 211 10*3/uL (ref 150–450)
RBC: 4.61 x10E6/uL (ref 4.14–5.80)
RDW: 13.1 % (ref 11.6–15.4)
WBC: 5.4 10*3/uL (ref 3.4–10.8)

## 2023-08-22 LAB — LIPID PANEL
Chol/HDL Ratio: 3.4 ratio (ref 0.0–5.0)
Cholesterol, Total: 190 mg/dL (ref 100–199)
HDL: 56 mg/dL (ref >39)
LDL Chol Calc (NIH): 116 mg/dL — ABNORMAL HIGH (ref 0–99)
Triglycerides: 102 mg/dL (ref 0–149)
VLDL Cholesterol Cal: 18 mg/dL (ref 5–40)

## 2023-08-22 LAB — PSA: Prostate Specific Ag, Serum: 0.9 ng/mL (ref 0.0–4.0)

## 2023-08-22 LAB — TSH: TSH: 2.37 u[IU]/mL (ref 0.450–4.500)

## 2023-08-22 NOTE — Progress Notes (Signed)
 Updated med list

## 2023-08-29 ENCOUNTER — Encounter: Payer: Self-pay | Admitting: Family Medicine

## 2023-08-29 ENCOUNTER — Ambulatory Visit: Admitting: Nurse Practitioner

## 2023-08-29 ENCOUNTER — Encounter: Payer: Self-pay | Admitting: Nurse Practitioner

## 2023-08-29 VITALS — BP 124/68 | HR 70 | Temp 97.6°F | Resp 18 | Ht 73.0 in | Wt 258.7 lb

## 2023-08-29 DIAGNOSIS — H66002 Acute suppurative otitis media without spontaneous rupture of ear drum, left ear: Secondary | ICD-10-CM

## 2023-08-29 MED ORDER — AMOXICILLIN-POT CLAVULANATE 400-57 MG/5ML PO SUSR: 800.0000 mg | Freq: Two times a day (BID) | ORAL | 0 refills | Status: AC

## 2023-08-29 NOTE — Progress Notes (Signed)
 BP 124/68   Pulse 70   Temp 97.6 F (36.4 C)   Resp 18   Ht 6' 1 (1.854 m)   Wt 258 lb 11.2 oz (117.3 kg)   SpO2 97%   BMI 34.13 kg/m    Subjective:    Patient ID: Jeff Baker, male    DOB: [birth year], 78 y.o.   MRN: [id number]  HPI: Jeff Baker is a 78 y.o. male  Chief Complaint  Patient presents with   Ear Pain    Left onset 1 day    Discussed the use of AI scribe software for clinical note transcription with the patient, who gave verbal consent to proceed.  History of Present Illness Jeff Baker is a 78 year old male who presents with acute left ear pain.  He has been experiencing left ear pain that began last night. The pain was preceded by a mild sore throat for the past couple of days, which then intensified and spread to his ear last night.  No fever is present, and he has not had an earache in seventy years. There is no history of recent illness prior to the onset of symptoms.  He is allergic to [facility] or a similar medication.         08/21/2023    1:52 PM 07/03/2023    9:09 AM 05/01/2023   10:51 AM  Depression screen PHQ 2/9  Decreased Interest 0 0 0  Down, Depressed, Hopeless 0 0 0  PHQ - 2 Score 0 0 0  Altered sleeping 0 0 1  Tired, decreased energy 0 0   Change in appetite 0 0 2  Feeling bad or failure about yourself  0 0 0  Trouble concentrating 0 0 0  Moving slowly or fidgety/restless 0 0 0  Suicidal thoughts 0 0 0  PHQ-9 Score 0 0 3  Difficult doing work/chores Not difficult at all Not difficult at all Not difficult at all    Relevant past medical, surgical, family and social history reviewed and updated as indicated. Interim medical history since our last visit reviewed. Allergies and medications reviewed and updated.  Review of Systems  Ten systems reviewed and is negative except as mentioned in HPI      Objective:     BP 124/68   Pulse 70   Temp 97.6 F (36.4 C)   Resp 18   Ht 6' 1 (1.854 m)   Wt 258 lb  11.2 oz (117.3 kg)   SpO2 97%   BMI 34.13 kg/m    Wt Readings from Last 3 Encounters:  08/29/23 258 lb 11.2 oz (117.3 kg)  08/21/23 260 lb 14.4 oz (118.3 kg)  07/03/23 244 lb (110.7 kg)    Physical Exam Physical Exam GENERAL: Alert, cooperative, well developed, no acute distress. HEENT: Normocephalic, normal oropharynx, moist mucous membranes. Left TM erythematous, appears infected. CHEST: Clear to auscultation bilaterally. No wheezes, rhonchi, or crackles. CARDIOVASCULAR: Normal heart rate and rhythm. S1 and S2 normal without murmurs. ABDOMEN: Soft, non-tender, non-distended, without organomegaly. Normal bowel sounds. EXTREMITIES: No cyanosis or edema. NEUROLOGICAL: Cranial nerves grossly intact. Moves all extremities without gross motor or sensory deficit.   Results for orders placed or performed in visit on 08/18/23  PSA   Collection Time: 08/21/23  9:47 AM  Result Value Ref Range   Prostate Specific Ag, Serum 0.9 0.0 - 4.0 ng/mL  CBC   Collection Time: 08/21/23  9:47 AM  Result Value Ref Range  WBC 5.4 3.4 - 10.8 x10E3/uL   RBC 4.61 4.14 - 5.80 x10E6/uL   Hemoglobin 14.3 13.0 - 17.7 g/dL   Hematocrit 57.1 62.4 - 51.0 %   MCV 93 79 - 97 fL   MCH 31.0 26.6 - 33.0 pg   MCHC 33.4 31.5 - 35.7 g/dL   RDW 86.8 88.3 - 84.5 %   Platelets 211 150 - 450 x10E3/uL  Hemoglobin A1c   Collection Time: 08/21/23  9:47 AM  Result Value Ref Range   Hgb A1c MFr Bld 6.1 (H) 4.8 - 5.6 %   Est. average glucose Bld gHb Est-mCnc 128 mg/dL  Comprehensive Metabolic Panel (CMET)   Collection Time: 08/21/23  9:47 AM  Result Value Ref Range   Glucose 116 (H) 70 - 99 mg/dL   BUN 17 8 - 27 mg/dL   Creatinine, Ser 9.02 0.76 - 1.27 mg/dL   eGFR 80 >40 fO/fpw/8.26   BUN/Creatinine Ratio 18 10 - 24   Sodium 148 (H) 134 - 144 mmol/L   Potassium 5.0 3.5 - 5.2 mmol/L   Chloride 108 (H) 96 - 106 mmol/L   CO2 22 20 - 29 mmol/L   Calcium 9.1 8.6 - 10.2 mg/dL   Total Protein 6.7 6.0 - 8.5 g/dL    Albumin 4.3 3.8 - 4.8 g/dL   Globulin, Total 2.4 1.5 - 4.5 g/dL   Bilirubin Total 0.6 0.0 - 1.2 mg/dL   Alkaline Phosphatase 80 44 - 121 IU/L   AST 20 0 - 40 IU/L   ALT 22 0 - 44 IU/L  Lipid panel   Collection Time: 08/21/23  9:47 AM  Result Value Ref Range   Cholesterol, Total 190 100 - 199 mg/dL   Triglycerides 897 0 - 149 mg/dL   HDL 56 >60 mg/dL   VLDL Cholesterol Cal 18 5 - 40 mg/dL   LDL Chol Calc (NIH) 883 (H) 0 - 99 mg/dL   Chol/HDL Ratio 3.4 0.0 - 5.0 ratio  TSH   Collection Time: 08/21/23  9:47 AM  Result Value Ref Range   TSH 2.370 0.450 - 4.500 uIU/mL          Assessment & Plan:   Problem List Items Addressed This Visit   None Visit Diagnoses       Non-recurrent acute suppurative otitis media of left ear without spontaneous rupture of tympanic membrane    -  Primary   Relevant Medications   amoxicillin -clavulanate (AUGMENTIN ) 400-57 MG/5ML suspension        Assessment and Plan Assessment & Plan Left acute otitis media Acute onset of left ear pain with associated redness, indicating infection. No fever reported. No prior history of earache in seventy years. Possible progression from sore throat to ear infection. No allergies to Augmentin  noted. Prefers liquid formulation due to difficulty swallowing pills. - Prescribe Augmentin  liquid formulation, 800 mg twice daily for seven days. - Advise ibuprofen  600 mg every eight hours for pain and swelling management. - Send prescription to [facility] on [address]        Follow up plan: Return if symptoms worsen or fail to improve.

## 2023-08-29 NOTE — Telephone Encounter (Signed)
 Noted - pt was seen today by Mliss Spray, FNP at [facility] - received ABX.

## 2023-11-01 ENCOUNTER — Encounter: Payer: Self-pay | Admitting: Family Medicine

## 2024-02-08 ENCOUNTER — Encounter: Admitting: Physician Assistant

## 2024-02-08 ENCOUNTER — Ambulatory Visit: Admitting: Family Medicine

## 2024-07-09 ENCOUNTER — Ambulatory Visit
